# Patient Record
Sex: Female | Born: 1970 | Race: White | Hispanic: No | Marital: Married | Smoking: Never smoker
Health system: Southern US, Community
[De-identification: ages and names within clinical notes are randomized; demographics above are authoritative.]

## PROBLEM LIST (undated history)

## (undated) DIAGNOSIS — Z803 Family history of malignant neoplasm of breast: Secondary | ICD-10-CM

## (undated) DIAGNOSIS — Z1379 Encounter for other screening for genetic and chromosomal anomalies: Principal | ICD-10-CM

## (undated) DIAGNOSIS — Z9189 Other specified personal risk factors, not elsewhere classified: Secondary | ICD-10-CM

## (undated) DIAGNOSIS — E7800 Pure hypercholesterolemia, unspecified: Secondary | ICD-10-CM

## (undated) DIAGNOSIS — F419 Anxiety disorder, unspecified: Secondary | ICD-10-CM

## (undated) DIAGNOSIS — J45909 Unspecified asthma, uncomplicated: Secondary | ICD-10-CM

## (undated) DIAGNOSIS — M719 Bursopathy, unspecified: Secondary | ICD-10-CM

## (undated) DIAGNOSIS — Z91B Personal risk factor of exposure to diethylstilbestrol: Secondary | ICD-10-CM

## (undated) DIAGNOSIS — IMO0002 Reserved for concepts with insufficient information to code with codable children: Secondary | ICD-10-CM

## (undated) DIAGNOSIS — A0472 Enterocolitis due to Clostridium difficile, not specified as recurrent: Secondary | ICD-10-CM

## (undated) HISTORY — DX: Bursopathy, unspecified: M71.9

## (undated) HISTORY — DX: Family history of malignant neoplasm of breast: Z80.3

## (undated) HISTORY — DX: Enterocolitis due to Clostridium difficile, not specified as recurrent: A04.72

## (undated) HISTORY — DX: Encounter for other screening for genetic and chromosomal anomalies: Z13.79

## (undated) HISTORY — DX: Pure hypercholesterolemia, unspecified: E78.00

## (undated) HISTORY — DX: Personal risk factor of exposure to diethylstilbestrol: Z91.B

## (undated) HISTORY — DX: Anxiety disorder, unspecified: F41.9

## (undated) HISTORY — PX: DILATION AND CURETTAGE OF UTERUS: SHX78

## (undated) HISTORY — DX: Reserved for concepts with insufficient information to code with codable children: IMO0002

## (undated) HISTORY — PX: WRIST SURGERY: SHX841

## (undated) HISTORY — DX: Other specified personal risk factors, not elsewhere classified: Z91.89

---

## 1997-11-17 ENCOUNTER — Other Ambulatory Visit: Admission: RE | Admit: 1997-11-17 | Discharge: 1997-11-17 | Payer: Self-pay | Admitting: Gynecology

## 1998-09-16 ENCOUNTER — Inpatient Hospital Stay (HOSPITAL_COMMUNITY): Admission: AD | Admit: 1998-09-16 | Discharge: 1998-09-16 | Payer: Self-pay | Admitting: Gynecology

## 1998-09-17 ENCOUNTER — Inpatient Hospital Stay (HOSPITAL_COMMUNITY): Admission: AD | Admit: 1998-09-17 | Discharge: 1998-09-19 | Payer: Self-pay | Admitting: Gynecology

## 1998-09-20 ENCOUNTER — Encounter (HOSPITAL_COMMUNITY): Admission: RE | Admit: 1998-09-20 | Discharge: 1998-12-19 | Payer: Self-pay | Admitting: Gynecology

## 1998-11-10 ENCOUNTER — Other Ambulatory Visit: Admission: RE | Admit: 1998-11-10 | Discharge: 1998-11-10 | Payer: Self-pay | Admitting: Gynecology

## 2000-03-21 ENCOUNTER — Other Ambulatory Visit: Admission: RE | Admit: 2000-03-21 | Discharge: 2000-03-21 | Payer: Self-pay | Admitting: Gynecology

## 2000-05-30 ENCOUNTER — Other Ambulatory Visit: Admission: RE | Admit: 2000-05-30 | Discharge: 2000-05-30 | Payer: Self-pay | Admitting: Gynecology

## 2000-12-11 ENCOUNTER — Other Ambulatory Visit: Admission: RE | Admit: 2000-12-11 | Discharge: 2000-12-11 | Payer: Self-pay | Admitting: Gynecology

## 2001-07-02 ENCOUNTER — Other Ambulatory Visit: Admission: RE | Admit: 2001-07-02 | Discharge: 2001-07-02 | Payer: Self-pay | Admitting: Gynecology

## 2002-09-29 ENCOUNTER — Encounter: Admission: RE | Admit: 2002-09-29 | Discharge: 2002-09-29 | Payer: Self-pay | Admitting: Gynecology

## 2002-09-29 ENCOUNTER — Encounter: Payer: Self-pay | Admitting: Gynecology

## 2002-10-07 ENCOUNTER — Encounter: Payer: Self-pay | Admitting: Gynecology

## 2002-10-07 ENCOUNTER — Encounter: Admission: RE | Admit: 2002-10-07 | Discharge: 2002-10-07 | Payer: Self-pay | Admitting: Gynecology

## 2002-10-22 ENCOUNTER — Other Ambulatory Visit: Admission: RE | Admit: 2002-10-22 | Discharge: 2002-10-22 | Payer: Self-pay | Admitting: Gynecology

## 2003-02-14 DIAGNOSIS — IMO0002 Reserved for concepts with insufficient information to code with codable children: Secondary | ICD-10-CM

## 2003-02-14 HISTORY — DX: Reserved for concepts with insufficient information to code with codable children: IMO0002

## 2003-10-26 ENCOUNTER — Other Ambulatory Visit: Admission: RE | Admit: 2003-10-26 | Discharge: 2003-10-26 | Payer: Self-pay | Admitting: Gynecology

## 2004-03-04 ENCOUNTER — Encounter: Admission: RE | Admit: 2004-03-04 | Discharge: 2004-03-04 | Payer: Self-pay | Admitting: Gynecology

## 2004-06-06 ENCOUNTER — Other Ambulatory Visit: Admission: RE | Admit: 2004-06-06 | Discharge: 2004-06-06 | Payer: Self-pay | Admitting: Gynecology

## 2004-09-27 ENCOUNTER — Other Ambulatory Visit: Admission: RE | Admit: 2004-09-27 | Discharge: 2004-09-27 | Payer: Self-pay | Admitting: Gynecology

## 2004-10-03 ENCOUNTER — Encounter: Admission: RE | Admit: 2004-10-03 | Discharge: 2004-10-03 | Payer: Self-pay | Admitting: Gynecology

## 2004-11-01 ENCOUNTER — Other Ambulatory Visit: Admission: RE | Admit: 2004-11-01 | Discharge: 2004-11-01 | Payer: Self-pay | Admitting: Gynecology

## 2005-03-09 ENCOUNTER — Encounter: Admission: RE | Admit: 2005-03-09 | Discharge: 2005-03-09 | Payer: Self-pay | Admitting: Gynecology

## 2005-11-23 ENCOUNTER — Other Ambulatory Visit: Admission: RE | Admit: 2005-11-23 | Discharge: 2005-11-23 | Payer: Self-pay | Admitting: Gynecology

## 2006-05-01 ENCOUNTER — Encounter: Admission: RE | Admit: 2006-05-01 | Discharge: 2006-05-01 | Payer: Self-pay | Admitting: Gynecology

## 2007-04-02 ENCOUNTER — Other Ambulatory Visit: Admission: RE | Admit: 2007-04-02 | Discharge: 2007-04-02 | Payer: Self-pay | Admitting: Gynecology

## 2007-06-13 ENCOUNTER — Encounter: Admission: RE | Admit: 2007-06-13 | Discharge: 2007-06-13 | Payer: Self-pay | Admitting: Gynecology

## 2008-06-25 ENCOUNTER — Other Ambulatory Visit: Admission: RE | Admit: 2008-06-25 | Discharge: 2008-06-25 | Payer: Self-pay | Admitting: Gynecology

## 2008-06-25 ENCOUNTER — Encounter: Payer: Self-pay | Admitting: Gynecology

## 2008-06-25 ENCOUNTER — Ambulatory Visit: Payer: Self-pay | Admitting: Gynecology

## 2008-07-17 ENCOUNTER — Encounter: Admission: RE | Admit: 2008-07-17 | Discharge: 2008-07-17 | Payer: Self-pay | Admitting: Gynecology

## 2009-06-28 ENCOUNTER — Ambulatory Visit: Payer: Self-pay | Admitting: Gynecology

## 2009-06-28 ENCOUNTER — Other Ambulatory Visit: Admission: RE | Admit: 2009-06-28 | Discharge: 2009-06-28 | Payer: Self-pay | Admitting: Gynecology

## 2009-06-30 ENCOUNTER — Ambulatory Visit: Payer: Self-pay | Admitting: Gynecology

## 2009-08-27 ENCOUNTER — Encounter: Admission: RE | Admit: 2009-08-27 | Discharge: 2009-08-27 | Payer: Self-pay | Admitting: Gynecology

## 2010-03-06 ENCOUNTER — Encounter: Payer: Self-pay | Admitting: Gynecology

## 2010-03-07 ENCOUNTER — Encounter: Payer: Self-pay | Admitting: Gynecology

## 2010-07-08 ENCOUNTER — Other Ambulatory Visit: Payer: Self-pay | Admitting: Gynecology

## 2010-07-08 ENCOUNTER — Other Ambulatory Visit (HOSPITAL_COMMUNITY)
Admission: RE | Admit: 2010-07-08 | Discharge: 2010-07-08 | Disposition: A | Discharge status: Home / Self Care | Payer: BC Managed Care – PPO | Source: Ambulatory Visit | Attending: Gynecology | Admitting: Gynecology

## 2010-07-08 ENCOUNTER — Encounter (INDEPENDENT_AMBULATORY_CARE_PROVIDER_SITE_OTHER): Payer: BC Managed Care – PPO | Admitting: Gynecology

## 2010-07-08 DIAGNOSIS — E7800 Pure hypercholesterolemia, unspecified: Secondary | ICD-10-CM

## 2010-07-08 DIAGNOSIS — Z124 Encounter for screening for malignant neoplasm of cervix: Secondary | ICD-10-CM | POA: Insufficient documentation

## 2010-07-08 DIAGNOSIS — Z833 Family history of diabetes mellitus: Secondary | ICD-10-CM

## 2010-07-08 DIAGNOSIS — Z01419 Encounter for gynecological examination (general) (routine) without abnormal findings: Secondary | ICD-10-CM

## 2010-07-08 DIAGNOSIS — R823 Hemoglobinuria: Secondary | ICD-10-CM

## 2010-07-18 DIAGNOSIS — Z029 Encounter for administrative examinations, unspecified: Secondary | ICD-10-CM

## 2010-10-05 ENCOUNTER — Other Ambulatory Visit: Payer: Self-pay | Admitting: Gynecology

## 2010-10-05 DIAGNOSIS — Z1231 Encounter for screening mammogram for malignant neoplasm of breast: Secondary | ICD-10-CM

## 2010-10-26 ENCOUNTER — Ambulatory Visit: Payer: BC Managed Care – PPO

## 2011-02-17 ENCOUNTER — Other Ambulatory Visit: Payer: Self-pay | Admitting: Dermatology

## 2011-02-22 ENCOUNTER — Ambulatory Visit
Admission: RE | Admit: 2011-02-22 | Discharge: 2011-02-22 | Disposition: A | Discharge status: Home / Self Care | Payer: BC Managed Care – PPO | Source: Ambulatory Visit | Attending: Gynecology | Admitting: Gynecology

## 2011-02-22 DIAGNOSIS — Z1231 Encounter for screening mammogram for malignant neoplasm of breast: Secondary | ICD-10-CM

## 2011-09-28 ENCOUNTER — Encounter: Payer: Self-pay | Admitting: Gynecology

## 2011-09-28 ENCOUNTER — Ambulatory Visit (INDEPENDENT_AMBULATORY_CARE_PROVIDER_SITE_OTHER): Payer: BC Managed Care – PPO | Admitting: Gynecology

## 2011-09-28 VITALS — BP 124/78 | Ht 67.0 in | Wt 152.0 lb

## 2011-09-28 DIAGNOSIS — Z131 Encounter for screening for diabetes mellitus: Secondary | ICD-10-CM

## 2011-09-28 DIAGNOSIS — N926 Irregular menstruation, unspecified: Secondary | ICD-10-CM

## 2011-09-28 DIAGNOSIS — Z13220 Encounter for screening for lipoid disorders: Secondary | ICD-10-CM

## 2011-09-28 DIAGNOSIS — Z01419 Encounter for gynecological examination (general) (routine) without abnormal findings: Secondary | ICD-10-CM

## 2011-09-28 LAB — CBC WITH DIFFERENTIAL/PLATELET
Basophils Absolute: 0 10*3/uL (ref 0.0–0.1)
Basophils Relative: 1 % (ref 0–1)
Eosinophils Absolute: 0.1 10*3/uL (ref 0.0–0.7)
Eosinophils Relative: 1 % (ref 0–5)
MCH: 29.7 pg (ref 26.0–34.0)
MCHC: 33.7 g/dL (ref 30.0–36.0)
MCV: 88.1 fL (ref 78.0–100.0)
Platelets: 336 10*3/uL (ref 150–400)
RDW: 13 % (ref 11.5–15.5)
WBC: 6.6 10*3/uL (ref 4.0–10.5)

## 2011-09-28 LAB — LIPID PANEL
LDL Cholesterol: 145 mg/dL — ABNORMAL HIGH (ref 0–99)
VLDL: 30 mg/dL (ref 0–40)

## 2011-09-28 LAB — TSH: TSH: 1.789 u[IU]/mL (ref 0.350–4.500)

## 2011-09-28 NOTE — Progress Notes (Signed)
Amanda Haynes [birth year] [id number]        41 y.o.  G5P3 for annual exam.  Several issues noted below.  Past medical history,surgical history, medications, allergies, family history and social history were all reviewed and documented in the EPIC chart. ROS:  Was performed and pertinent positives and negatives are included in the history.  Exam: Amanda Haynes assistant Filed Vitals:   09/28/11 0940  BP: 124/78  Height: 5\' 7"  (1.702 m)  Weight: 152 lb (68.947 kg)   General appearance  Normal Skin grossly normal Head/Neck normal with no cervical or supraclavicular adenopathy thyroid normal Lungs  clear Cardiac RR, without RMG Abdominal  soft, nontender, without masses, organomegaly or hernia Breasts  examined lying and sitting without masses, retractions, discharge or axillary adenopathy. Pelvic  Ext/BUS/vagina  normal   Cervix  normal   Uterus  anteverted, normal size, shape and contour, midline and mobile nontender   Adnexa  Without masses or tenderness    Anus and perineum  normal   Rectovaginal  normal sphincter tone without palpated masses or tenderness.    Assessment/Plan:  41 y.o. G5P3 female for annual exam, vasectomy birth control.   1. Irregular menses. Patient knows of this past year that her periods are monthly but she will spot before and afterwards such that she is bleeding for up to 2 weeks continuously. No hot flushes night sweats other symptoms. We'll check baseline labs to include TSH prolactin FSH and sonohysterogram rule out structural abnormalities. 2. Family history of breast cancer. Mother developed breast cancer at age 59. Also with several maternal great aunts with breast cancer. We have discussed in the past the whole issue of genetic linkage. Her sister did undergo genetic testing which was negative. The issues of aggressive prophylactic management she was seen positive to include prophylactic mastectomies or more aggressive screening with MRI as well as the  prophylactic salpingo-oophorectomy also discussed.  Patient had her mammogram in January. At this point she feels comfortable with continued screening. I did recommend, tomosynthesis with her next mammogram where she does want to pursue genetic screening to let me know. SBE monthly reviewed. 3. Pap smear. Last Pap smear 2012. History of low-grade SIL in 2005 with negative colposcopy and ECC. All subsequent Pap smears have been normal. No Pap smear done today. Discussed current screening guidelines and plan every 3-5 your screening. 4. Health maintenance. Baseline CBC lipid profile glucose urinalysis ordered along with the lab work. Follow up for sonohysterogram otherwise annually.   Dara Lords MD, 10:21 AM 09/28/2011

## 2011-09-28 NOTE — Patient Instructions (Signed)
Follow up for ultrasound as scheduled 

## 2011-09-29 LAB — URINALYSIS W MICROSCOPIC + REFLEX CULTURE
Bacteria, UA: NONE SEEN
Glucose, UA: NEGATIVE mg/dL
Ketones, ur: NEGATIVE mg/dL
Leukocytes, UA: NEGATIVE
Nitrite: NEGATIVE
Protein, ur: NEGATIVE mg/dL

## 2011-09-30 LAB — URINE CULTURE: Colony Count: 75000

## 2011-10-02 ENCOUNTER — Ambulatory Visit (INDEPENDENT_AMBULATORY_CARE_PROVIDER_SITE_OTHER): Payer: BC Managed Care – PPO | Admitting: Gynecology

## 2011-10-02 ENCOUNTER — Encounter: Payer: Self-pay | Admitting: Gynecology

## 2011-10-02 ENCOUNTER — Ambulatory Visit (INDEPENDENT_AMBULATORY_CARE_PROVIDER_SITE_OTHER): Payer: BC Managed Care – PPO

## 2011-10-02 ENCOUNTER — Other Ambulatory Visit: Payer: Self-pay | Admitting: Gynecology

## 2011-10-02 DIAGNOSIS — N840 Polyp of corpus uteri: Secondary | ICD-10-CM

## 2011-10-02 DIAGNOSIS — R1903 Right lower quadrant abdominal swelling, mass and lump: Secondary | ICD-10-CM

## 2011-10-02 DIAGNOSIS — N926 Irregular menstruation, unspecified: Secondary | ICD-10-CM

## 2011-10-02 DIAGNOSIS — N8300 Follicular cyst of ovary, unspecified side: Secondary | ICD-10-CM

## 2011-10-02 DIAGNOSIS — D3910 Neoplasm of uncertain behavior of unspecified ovary: Secondary | ICD-10-CM

## 2011-10-02 DIAGNOSIS — D259 Leiomyoma of uterus, unspecified: Secondary | ICD-10-CM

## 2011-10-02 DIAGNOSIS — R1031 Right lower quadrant pain: Secondary | ICD-10-CM

## 2011-10-02 DIAGNOSIS — D251 Intramural leiomyoma of uterus: Secondary | ICD-10-CM

## 2011-10-02 DIAGNOSIS — N83209 Unspecified ovarian cyst, unspecified side: Secondary | ICD-10-CM

## 2011-10-02 DIAGNOSIS — N920 Excessive and frequent menstruation with regular cycle: Secondary | ICD-10-CM

## 2011-10-02 NOTE — Progress Notes (Signed)
Patient presents for sonohysterogram due to spotting before and after her menses.  Ultrasound shows overall uterine size normal. Several small myomas measuring 13 mm in largest. Endometrial echo 8.7 mm. Right ovary with thin walled cystic mass 38 mm mean diffuse homogeneous low level echoes. Negative color flow. Left ovary normal. Cul-de-sac negative. Sonohysterogram performed, sterile technique, easy catheter introduction, good distention with several small lower uterine segment polyps.  Endometrial sample taken, patient tolerated well.  Assessment and plan: 1. Several small endometrial polyps with prolonged bleeding before and after menses. 2. Low level homogeneous echo cyst 38 mm right ovary, questionable endometrioma versus hemorrhagic cyst. Reviewed both findings with the patient and options. I think from the polyp standpoint hysteroscopy D&C warranted. I reviewed what is involved with the procedure to include the use of the hysteroscope, resectoscope and D&C portion. The risks of infection, prolonged antibiotics, hemorrhage necessitating transfusion and the risks of transfusion, uterine perforation with damage to internal organs including bowel bladder ureters vessels and nerves necessitating major exploratory reparative surgeries and future reparative surgeries including ostomy formation bowel resection bladder repair ureteral damage repair all reviewed with her. The risk of distended media absorption leading to metabolic complications including coma and seizures were reviewed. The need to repeat the ultrasound to follow up on the right ovarian cyst was also discussed and the possibility of laparoscopy if it persists or enlarges. After a lengthy discussion we both agree, assuming the endometrial sample's benign, to wait 2 months and redo the ultrasound. If at that point we feel laparoscopy is warranted them we'll do laparoscopy/hysteroscopy together. If the cyst resolves then we'll proceed with  hysteroscopy alone. Patient agrees with the plan.

## 2011-10-02 NOTE — Patient Instructions (Signed)
Follow up for ultrasound in 2 months 

## 2011-10-10 ENCOUNTER — Other Ambulatory Visit: Payer: Self-pay | Admitting: Gynecology

## 2011-10-10 DIAGNOSIS — E7800 Pure hypercholesterolemia, unspecified: Secondary | ICD-10-CM

## 2011-11-27 ENCOUNTER — Other Ambulatory Visit: Payer: BC Managed Care – PPO

## 2011-11-27 ENCOUNTER — Ambulatory Visit: Payer: BC Managed Care – PPO | Admitting: Gynecology

## 2011-11-29 ENCOUNTER — Other Ambulatory Visit: Payer: BC Managed Care – PPO

## 2011-11-29 ENCOUNTER — Ambulatory Visit: Payer: BC Managed Care – PPO | Admitting: Gynecology

## 2011-12-01 ENCOUNTER — Ambulatory Visit (INDEPENDENT_AMBULATORY_CARE_PROVIDER_SITE_OTHER): Payer: BC Managed Care – PPO | Admitting: Gynecology

## 2011-12-01 ENCOUNTER — Encounter: Payer: Self-pay | Admitting: Gynecology

## 2011-12-01 ENCOUNTER — Ambulatory Visit (INDEPENDENT_AMBULATORY_CARE_PROVIDER_SITE_OTHER): Payer: BC Managed Care – PPO

## 2011-12-01 DIAGNOSIS — R102 Pelvic and perineal pain: Secondary | ICD-10-CM

## 2011-12-01 DIAGNOSIS — N840 Polyp of corpus uteri: Secondary | ICD-10-CM

## 2011-12-01 DIAGNOSIS — N949 Unspecified condition associated with female genital organs and menstrual cycle: Secondary | ICD-10-CM

## 2011-12-01 DIAGNOSIS — N83209 Unspecified ovarian cyst, unspecified side: Secondary | ICD-10-CM

## 2011-12-01 NOTE — Progress Notes (Signed)
Patient presents for follow up ultrasound.  Was evaluated in August for irregular bleeding with sonohysterogram which showed endometrial polyps and a right ovarian ground glass cyst suspicious for endometrioma.  She presents for follow up ultrasound before proceeding with hysteroscopy D&C to see if this cyst needs to be addressed. She does report having pelvic cramping almost daily and discomfort.  Ultrasound shows uterus with 2 small myomas 18 and 10 mm. Endometrial echo 14 mm. Left ovary normal right ovary with persistent avascular groundglass mass 49 x 39 x 42 mm suspicious for endometrioma. No free fluid.  Assessment and plan: History of irregular bleeding with prior sonohysterogram suspicious for endometrial polyps. Persistent right ovarian mass suspicious for endometrioma in patient symptomatic with pelvic discomfort. Recommend proceeding with laparoscopic ovarian cystectomy, hysteroscopy resection of polyp D&C. I reviewed with involved with the procedure to include the expected intraoperative postoperative courses, instrumentation, use of both the laparoscope and hysteroscope, multiple port sites, insufflation, trocar placement, use of electrocautery, sharp blunt dissection, harmonic scalpel, hysteroscopic resectoscope and D&C portion of the procedure. Tentative cystectomy possible salpingo-oophorectomy reviewed. Pathology possibilities to include endometriosis discussed. Patient understands that I may leave active disease or abort the procedure if significant endometriosis with scarring encountered with follow up discussion about more definitive treatment such as hysterectomy. No guarantees for pain relief or bleeding relief reviewed. The risks to include infection, prolonged antibiotics, incisional complications requiring opening and draining of incisions and closure by secondary intention and long-term issues to include cosmetic/keloid and hernia formation reviewed. The risk of hemorrhage necessitating  transfusion and the risks of transfusion discussed to include transfusion reaction, hepatitis, HIV, mad cow disease and other unknown entities. The risk of inadvertent injury to internal organs, either immediately recognized or delay recognized, including bowel, bladder, ureters, vessels and nerves, uterine perforation, cervix and vagina damage necessitating major exploratory reparative surgeries and future reparative surgeries including bowel resection, bladder repair, ureteral damage repair, ostomy formation was all discussed understood and accepted. Issues of distended media absorption, metabolic complications/coma/seizures discussed.  The patient's questions were answered to her satisfaction she is ready to proceed with surgery we will schedule this at her convenience.

## 2011-12-01 NOTE — Patient Instructions (Signed)
Office will contact you to schedule surgery. 

## 2011-12-05 ENCOUNTER — Telehealth: Payer: Self-pay | Admitting: Gynecology

## 2011-12-05 NOTE — Telephone Encounter (Signed)
I called patient and scheduled her D&C, Hysteroscopy and Laparoscopic Ovarian Cystectomy for 01/03/12 at 8:30am at [facility].  Patient's pre-op consult with Dr. Velvet Bathe was scheduled for Friday, Nov 1 at 2:30pm. I discussed patient's financial responsibility to [facility] and will send her a financial letter detailing it.  She will expect a call from [facility] with her hospital instructions.

## 2011-12-15 ENCOUNTER — Ambulatory Visit (INDEPENDENT_AMBULATORY_CARE_PROVIDER_SITE_OTHER): Payer: BC Managed Care – PPO | Admitting: Gynecology

## 2011-12-15 ENCOUNTER — Encounter: Payer: Self-pay | Admitting: Gynecology

## 2011-12-15 DIAGNOSIS — R102 Pelvic and perineal pain: Secondary | ICD-10-CM

## 2011-12-15 DIAGNOSIS — N949 Unspecified condition associated with female genital organs and menstrual cycle: Secondary | ICD-10-CM

## 2011-12-15 DIAGNOSIS — N926 Irregular menstruation, unspecified: Secondary | ICD-10-CM

## 2011-12-15 DIAGNOSIS — N83209 Unspecified ovarian cyst, unspecified side: Secondary | ICD-10-CM

## 2011-12-15 DIAGNOSIS — N840 Polyp of corpus uteri: Secondary | ICD-10-CM

## 2011-12-15 NOTE — H&P (Addendum)
Amanda Haynes 09-15-1970 [id number]   History and Physical   Chief complaint: pelvic cramping, persistent right ovarian cystic mass suspicious for endometrioma, irregular bleeding, endometrial polyp    History of present illness: 41 y.o. G5P3 vasectomy birth control initially evaluated in August with history of prolonged pre-and post menstrual spotting and pelvic cramping. Sonohysterogram showed an endometrial polyp and and right ovarian cyst approximately 40 mm with groundglass appearance suspicious for endometrioma. Endometrial sample was benign. Patient had follow up ultrasound 2 months later with persistence of the cyst, pelvic cramping and pre-and post menstrual spotting. Patient is to be admitted for laparoscopic ovarian cystectomy, hysteroscopy D&C removal of endometrial polyp.    Past medical history,surgical history, medications, allergies, family history and social history were all reviewed and documented in the EPIC chart.  ROS: Was performed and pertinent positives and negatives are included in the history of present illness.    Exam: Kim assistant  General: well developed, well nourished female, no acute distress  HEENT: normal  Lungs: clear to auscultation without wheezing, rales or rhonchi  Cardiac: regular rate without rubs, murmurs or gallops  Abdomen: soft, nontender without masses, guarding, rebound, organomegaly  Pelvic: external bus vagina: normal  Cervix: grossly normal  Uterus: normal size, midline and mobile, nontender  Adnexa: without masses or tenderness    Assessment/Plan: 41 y.o. G5P3 with history of irregular bleeding and ultrasound suspicious for endometrial polyps. Persistent right ovarian mass suspicious for endometrioma in patient symptomatic with pelvic discomfort. For laparoscopic ovarian cystectomy, hysteroscopy resection of polyp D&C. I reviewed with the patient what is involved with the procedure to include the expected intraoperative postoperative  courses, instrumentation, use of both the laparoscope and hysteroscope, multiple port sites, insufflation, trocar placement, use of electrocautery, sharp blunt dissection, harmonic scalpel, hysteroscopic resectoscope and D&C portion of the procedure. Tentative cystectomy possible salpingo-oophorectomy reviewed. Pathology possibilities to include endometriosis discussed. Patient understands that I may leave active disease or abort the procedure if significant endometriosis with scarring encountered with follow up discussion about more definitive treatment such as hysterectomy. No guarantees for pain relief or bleeding relief reviewed. The risks to include infection, prolonged antibiotics, incisional complications requiring opening and draining of incisions and closure by secondary intention and long-term issues to include cosmetic/keloid and hernia formation reviewed. The risk of hemorrhage necessitating transfusion and the risks of transfusion discussed to include transfusion reaction, hepatitis, HIV, mad cow disease and other unknown entities. The risk of inadvertent injury to internal organs, either immediately recognized or delay recognized, including bowel, bladder, ureters, vessels and nerves, uterine perforation, cervix and vagina damage necessitating major exploratory reparative surgeries and future reparative surgeries including bowel resection, bladder repair, ureteral damage repair, ostomy formation was all discussed understood and accepted. Issues of distended media absorption, metabolic complications/coma/seizures discussed. The patient's questions were answered to her satisfaction she is ready to proceed with surgery we will schedule this at her convenience.      Dara Lords MD, 3:12 PM 12/15/2011

## 2011-12-15 NOTE — Patient Instructions (Signed)
Followup for surgery as scheduled. 

## 2011-12-15 NOTE — Progress Notes (Signed)
Amanda Haynes 1970-04-23 [id number]   Preoperative consult  Chief complaint: pelvic cramping, persistent left ovarian cystic mass suspicious for endometrioma, irregular bleeding, endometrial polyp  History of present illness: 41 y.o. G5P3 vasectomy birth control initially evaluated in August with history of prolonged pre-and post menstrual spotting and pelvic cramping. Sonohysterogram showed an endometrial polyp and and right ovarian cyst approximately 40 mm with groundglass appearance suspicious for endometrioma. Endometrial sample was benign.  Patient had follow up ultrasound 2 months later with persistence of the cyst, pelvic cramping and pre-and post menstrual spotting.  Patient is to be admitted for laparoscopic ovarian cystectomy, hysteroscopy D&C removal of endometrial polyp.  Past medical history,surgical history, medications, allergies, family history and social history were all reviewed and documented in the EPIC chart. ROS:  Was performed and pertinent positives and negatives are included in the history of present illness.  Exam:  Kim assistant General: well developed, well nourished female, no acute distress HEENT: normal  Lungs: clear to auscultation without wheezing, rales or rhonchi  Cardiac: regular rate without rubs, murmurs or gallops  Abdomen: soft, nontender without masses, guarding, rebound, organomegaly  Pelvic: external bus vagina: normal   Cervix: grossly normal  Uterus: normal size, midline and mobile, nontender  Adnexa: without masses or tenderness      Assessment/Plan:  41 y.o. G5P3 with history of irregular bleeding and ultrasound suspicious for endometrial polyps. Persistent right ovarian mass suspicious for endometrioma in patient symptomatic with pelvic discomfort. For laparoscopic ovarian cystectomy, hysteroscopy resection of polyp D&C. I reviewed with the patient what is involved with the procedure to include the expected intraoperative postoperative  courses, instrumentation, use of both the laparoscope and hysteroscope, multiple port sites, insufflation, trocar placement, use of electrocautery, sharp blunt dissection, harmonic scalpel, hysteroscopic resectoscope and D&C portion of the procedure. Tentative cystectomy possible salpingo-oophorectomy reviewed. Pathology possibilities to include endometriosis discussed. Patient understands that I may leave active disease or abort the procedure if significant endometriosis with scarring encountered with follow up discussion about more definitive treatment such as hysterectomy. No guarantees for pain relief or bleeding relief reviewed. The risks to include infection, prolonged antibiotics, incisional complications requiring opening and draining of incisions and closure by secondary intention and long-term issues to include cosmetic/keloid and hernia formation reviewed. The risk of hemorrhage necessitating transfusion and the risks of transfusion discussed to include transfusion reaction, hepatitis, HIV, mad cow disease and other unknown entities. The risk of inadvertent injury to internal organs, either immediately recognized or delay recognized, including bowel, bladder, ureters, vessels and nerves, uterine perforation, cervix and vagina damage necessitating major exploratory reparative surgeries and future reparative surgeries including bowel resection, bladder repair, ureteral damage repair, ostomy formation was all discussed understood and accepted. Issues of distended media absorption, metabolic complications/coma/seizures discussed. The patient's questions were answered to her satisfaction she is ready to proceed with surgery we will schedule this at her convenience.      Dara Lords MD, 3:00 PM 12/15/2011

## 2011-12-20 ENCOUNTER — Encounter (HOSPITAL_COMMUNITY): Payer: Self-pay | Admitting: Pharmacist

## 2011-12-29 ENCOUNTER — Encounter (HOSPITAL_COMMUNITY)
Admission: RE | Admit: 2011-12-29 | Discharge: 2011-12-29 | Disposition: A | Discharge status: Home / Self Care | Payer: BC Managed Care – PPO | Source: Ambulatory Visit | Attending: Gynecology | Admitting: Gynecology

## 2011-12-29 ENCOUNTER — Encounter (HOSPITAL_COMMUNITY): Payer: Self-pay

## 2011-12-29 HISTORY — DX: Unspecified asthma, uncomplicated: J45.909

## 2011-12-29 LAB — CBC
Hemoglobin: 13.6 g/dL (ref 12.0–15.0)
MCHC: 33.6 g/dL (ref 30.0–36.0)
RDW: 12.4 % (ref 11.5–15.5)
WBC: 10.2 10*3/uL (ref 4.0–10.5)

## 2011-12-29 LAB — SURGICAL PCR SCREEN
MRSA, PCR: NEGATIVE
Staphylococcus aureus: NEGATIVE

## 2011-12-29 NOTE — Patient Instructions (Addendum)
Your procedure is scheduled on:01/03/12  Enter through the Main Entrance at :0700 Pick up desk phone and dial [phone] and inform us of your arrival.  Please call [phone] if you have any problems the morning of surgery.  Remember: Do not eat or drink after midnight:Tuesday   Take these meds the morning of surgery with a sip of water:none  DO NOT wear jewelry, eye make-up, lipstick,body lotion, or dark fingernail polish. Do not shave for 48 hours prior to surgery.   Patients discharged on the day of surgery will not be allowed to drive home.

## 2012-01-02 MED ORDER — DEXTROSE 5 % IV SOLN
2.0000 g | INTRAVENOUS | Status: AC
  Administered 2012-01-03: 2 g via INTRAVENOUS
  Filled 2012-01-02: qty 2

## 2012-01-03 ENCOUNTER — Ambulatory Visit (HOSPITAL_COMMUNITY): Payer: BC Managed Care – PPO | Admitting: Anesthesiology

## 2012-01-03 ENCOUNTER — Ambulatory Visit (HOSPITAL_COMMUNITY)
Admission: RE | Admit: 2012-01-03 | Discharge: 2012-01-03 | Disposition: A | Discharge status: Home / Self Care | Payer: BC Managed Care – PPO | Source: Ambulatory Visit | Attending: Gynecology | Admitting: Gynecology

## 2012-01-03 ENCOUNTER — Encounter (HOSPITAL_COMMUNITY): Payer: Self-pay | Admitting: Anesthesiology

## 2012-01-03 ENCOUNTER — Encounter (HOSPITAL_COMMUNITY): Payer: Self-pay | Admitting: General Practice

## 2012-01-03 ENCOUNTER — Encounter (HOSPITAL_COMMUNITY)
Admission: RE | Disposition: A | Discharge status: Home / Self Care | Payer: Self-pay | Source: Ambulatory Visit | Attending: Gynecology

## 2012-01-03 DIAGNOSIS — Z01812 Encounter for preprocedural laboratory examination: Secondary | ICD-10-CM | POA: Insufficient documentation

## 2012-01-03 DIAGNOSIS — N83209 Unspecified ovarian cyst, unspecified side: Secondary | ICD-10-CM | POA: Insufficient documentation

## 2012-01-03 DIAGNOSIS — N840 Polyp of corpus uteri: Secondary | ICD-10-CM | POA: Insufficient documentation

## 2012-01-03 DIAGNOSIS — N938 Other specified abnormal uterine and vaginal bleeding: Secondary | ICD-10-CM | POA: Insufficient documentation

## 2012-01-03 DIAGNOSIS — N801 Endometriosis of ovary: Secondary | ICD-10-CM | POA: Insufficient documentation

## 2012-01-03 DIAGNOSIS — N80109 Endometriosis of ovary, unspecified side, unspecified depth: Secondary | ICD-10-CM | POA: Insufficient documentation

## 2012-01-03 DIAGNOSIS — N800 Endometriosis of uterus: Secondary | ICD-10-CM

## 2012-01-03 DIAGNOSIS — N803 Endometriosis of pelvic peritoneum, unspecified: Secondary | ICD-10-CM | POA: Insufficient documentation

## 2012-01-03 DIAGNOSIS — Z01818 Encounter for other preprocedural examination: Secondary | ICD-10-CM | POA: Insufficient documentation

## 2012-01-03 DIAGNOSIS — N912 Amenorrhea, unspecified: Secondary | ICD-10-CM

## 2012-01-03 DIAGNOSIS — N949 Unspecified condition associated with female genital organs and menstrual cycle: Secondary | ICD-10-CM | POA: Insufficient documentation

## 2012-01-03 DIAGNOSIS — R109 Unspecified abdominal pain: Secondary | ICD-10-CM | POA: Insufficient documentation

## 2012-01-03 HISTORY — PX: LAPAROSCOPY: SHX197

## 2012-01-03 HISTORY — PX: DILATATION & CURRETTAGE/HYSTEROSCOPY WITH RESECTOCOPE: SHX5572

## 2012-01-03 SURGERY — DILATATION & CURETTAGE/HYSTEROSCOPY WITH RESECTOCOPE
Anesthesia: General | Site: Uterus | Laterality: Right | Wound class: Clean Contaminated

## 2012-01-03 MED ORDER — FENTANYL CITRATE 0.05 MG/ML IJ SOLN
INTRAMUSCULAR | Status: AC
  Filled 2012-01-03: qty 5

## 2012-01-03 MED ORDER — BUPIVACAINE HCL (PF) 0.25 % IJ SOLN
INTRAMUSCULAR | Status: DC | PRN
  Administered 2012-01-03: 30 mL

## 2012-01-03 MED ORDER — NEOSTIGMINE METHYLSULFATE 1 MG/ML IJ SOLN
INTRAMUSCULAR | Status: AC
  Filled 2012-01-03: qty 10

## 2012-01-03 MED ORDER — HYDROMORPHONE HCL PF 1 MG/ML IJ SOLN
INTRAMUSCULAR | Status: AC
  Filled 2012-01-03: qty 1

## 2012-01-03 MED ORDER — GLYCOPYRROLATE 0.2 MG/ML IJ SOLN
INTRAMUSCULAR | Status: AC
  Filled 2012-01-03: qty 2

## 2012-01-03 MED ORDER — HEPARIN SODIUM (PORCINE) 5000 UNIT/ML IJ SOLN
INTRAMUSCULAR | Status: DC | PRN
  Administered 2012-01-03: 5000 [IU] via SUBCUTANEOUS

## 2012-01-03 MED ORDER — OXYCODONE-ACETAMINOPHEN 5-325 MG PO TABS: 1.0000 | ORAL_TABLET | ORAL | Status: DC | PRN

## 2012-01-03 MED ORDER — DEXTROSE-NACL 5-0.9 % IV SOLN: INTRAVENOUS | Status: DC

## 2012-01-03 MED ORDER — MEPERIDINE HCL 25 MG/ML IJ SOLN: 6.2500 mg | INTRAMUSCULAR | Status: DC | PRN

## 2012-01-03 MED ORDER — DEXAMETHASONE SODIUM PHOSPHATE 10 MG/ML IJ SOLN
INTRAMUSCULAR | Status: AC
  Filled 2012-01-03: qty 1

## 2012-01-03 MED ORDER — ONDANSETRON HCL 4 MG/2ML IJ SOLN
INTRAMUSCULAR | Status: AC
  Filled 2012-01-03: qty 2

## 2012-01-03 MED ORDER — LIDOCAINE HCL (CARDIAC) 20 MG/ML IV SOLN
INTRAVENOUS | Status: AC
  Filled 2012-01-03: qty 5

## 2012-01-03 MED ORDER — LACTATED RINGERS IV SOLN
INTRAVENOUS | Status: DC
  Administered 2012-01-03 (×3): via INTRAVENOUS

## 2012-01-03 MED ORDER — LIDOCAINE HCL (CARDIAC) 20 MG/ML IV SOLN
INTRAVENOUS | Status: DC | PRN
  Administered 2012-01-03: 60 mg via INTRAVENOUS

## 2012-01-03 MED ORDER — MIDAZOLAM HCL 2 MG/2ML IJ SOLN
INTRAMUSCULAR | Status: AC
  Filled 2012-01-03: qty 2

## 2012-01-03 MED ORDER — LIDOCAINE HCL 1 % IJ SOLN
INTRAMUSCULAR | Status: DC | PRN
  Administered 2012-01-03: 10 mL

## 2012-01-03 MED ORDER — ROCURONIUM BROMIDE 100 MG/10ML IV SOLN
INTRAVENOUS | Status: DC | PRN
  Administered 2012-01-03: 35 mg via INTRAVENOUS
  Administered 2012-01-03 (×2): 5 mg via INTRAVENOUS
  Administered 2012-01-03: 10 mg via INTRAVENOUS

## 2012-01-03 MED ORDER — PROPOFOL 10 MG/ML IV EMUL
INTRAVENOUS | Status: AC
  Filled 2012-01-03: qty 20

## 2012-01-03 MED ORDER — FENTANYL CITRATE 0.05 MG/ML IJ SOLN: 25.0000 ug | INTRAMUSCULAR | Status: DC | PRN

## 2012-01-03 MED ORDER — ONDANSETRON HCL 4 MG/2ML IJ SOLN: 4.0000 mg | Freq: Once | INTRAMUSCULAR | Status: DC | PRN

## 2012-01-03 MED ORDER — MIDAZOLAM HCL 5 MG/5ML IJ SOLN
INTRAMUSCULAR | Status: DC | PRN
  Administered 2012-01-03: 2 mg via INTRAVENOUS

## 2012-01-03 MED ORDER — HYDROMORPHONE HCL PF 1 MG/ML IJ SOLN
INTRAMUSCULAR | Status: DC | PRN
  Administered 2012-01-03 (×2): 1 mg via INTRAVENOUS

## 2012-01-03 MED ORDER — FENTANYL CITRATE 0.05 MG/ML IJ SOLN
INTRAMUSCULAR | Status: DC | PRN
  Administered 2012-01-03 (×5): 50 ug via INTRAVENOUS

## 2012-01-03 MED ORDER — ROCURONIUM BROMIDE 50 MG/5ML IV SOLN
INTRAVENOUS | Status: AC
  Filled 2012-01-03: qty 1

## 2012-01-03 MED ORDER — KETOROLAC TROMETHAMINE 30 MG/ML IJ SOLN: 15.0000 mg | Freq: Once | INTRAMUSCULAR | Status: DC | PRN

## 2012-01-03 MED ORDER — PROPOFOL 10 MG/ML IV EMUL
INTRAVENOUS | Status: DC | PRN
  Administered 2012-01-03: 150 mg via INTRAVENOUS

## 2012-01-03 MED ORDER — DEXAMETHASONE SODIUM PHOSPHATE 4 MG/ML IJ SOLN
INTRAMUSCULAR | Status: DC | PRN
  Administered 2012-01-03: 10 mg via INTRAVENOUS

## 2012-01-03 MED ORDER — NEOSTIGMINE METHYLSULFATE 1 MG/ML IJ SOLN
INTRAMUSCULAR | Status: DC | PRN
  Administered 2012-01-03: 2 mg via INTRAVENOUS

## 2012-01-03 MED ORDER — ONDANSETRON HCL 4 MG/2ML IJ SOLN
INTRAMUSCULAR | Status: DC | PRN
  Administered 2012-01-03: 4 mg via INTRAVENOUS

## 2012-01-03 MED ORDER — GLYCOPYRROLATE 0.2 MG/ML IJ SOLN
INTRAMUSCULAR | Status: DC | PRN
  Administered 2012-01-03: 0.4 mg via INTRAVENOUS

## 2012-01-03 MED ORDER — LACTATED RINGERS IR SOLN
Status: DC | PRN
  Administered 2012-01-03: 3000 mL

## 2012-01-03 SURGICAL SUPPLY — 38 items
BAG SPEC RTRVL LRG 6X4 10 (ENDOMECHANICALS)
BARRIER ADHS 3X4 INTERCEED (GAUZE/BANDAGES/DRESSINGS) IMPLANT
BLADE SURG 15 STRL LF C SS BP (BLADE) ×2 IMPLANT
BLADE SURG 15 STRL SS (BLADE) ×3
BRR ADH 4X3 ABS CNTRL BYND (GAUZE/BANDAGES/DRESSINGS)
CABLE HIGH FREQUENCY MONO STRZ (ELECTRODE) IMPLANT
CANISTER SUCTION 2500CC (MISCELLANEOUS) ×3 IMPLANT
CATH ROBINSON RED A/P 16FR (CATHETERS) ×3 IMPLANT
CLOTH BEACON ORANGE TIMEOUT ST (SAFETY) ×3 IMPLANT
CONTAINER PREFILL 10% NBF 60ML (FORM) ×6 IMPLANT
DRESSING TELFA 8X3 (GAUZE/BANDAGES/DRESSINGS) ×3 IMPLANT
ELECT LOOP GYNE PRO 24FR (CUTTING LOOP) ×3
ELECT REM PT RETURN 9FT ADLT (ELECTROSURGICAL)
ELECTRODE LOOP GYNE PRO 24FR (CUTTING LOOP) IMPLANT
ELECTRODE REM PT RTRN 9FT ADLT (ELECTROSURGICAL) IMPLANT
GLOVE BIO SURGEON STRL SZ7.5 (GLOVE) ×6 IMPLANT
GOWN PREVENTION PLUS LG XLONG (DISPOSABLE) ×6 IMPLANT
GOWN STRL REIN XL XLG (GOWN DISPOSABLE) ×9 IMPLANT
LOOP ANGLED CUTTING 22FR (CUTTING LOOP) ×4 IMPLANT
NDL SPNL 22GX3.5 QUINCKE BK (NEEDLE) IMPLANT
NEEDLE SPNL 22GX3.5 QUINCKE BK (NEEDLE) IMPLANT
NS IRRIG 1000ML POUR BTL (IV SOLUTION) ×3 IMPLANT
PACK HYSTEROSCOPY LF (CUSTOM PROCEDURE TRAY) ×3 IMPLANT
PACK LAPAROSCOPY BASIN (CUSTOM PROCEDURE TRAY) ×3 IMPLANT
PAD OB MATERNITY 4.3X12.25 (PERSONAL CARE ITEMS) ×3 IMPLANT
POUCH SPECIMEN RETRIEVAL 10MM (ENDOMECHANICALS) IMPLANT
PROTECTOR NERVE ULNAR (MISCELLANEOUS) ×3 IMPLANT
SCALPEL HARMONIC ACE (MISCELLANEOUS) ×1 IMPLANT
SET IRRIG TUBING LAPAROSCOPIC (IRRIGATION / IRRIGATOR) ×1 IMPLANT
SLEEVE Z-THREAD 5X100MM (TROCAR) IMPLANT
SOLUTION ELECTROLUBE (MISCELLANEOUS) IMPLANT
SUT PLAIN 4 0 FS 2 27 (SUTURE) ×3 IMPLANT
SUT VICRYL 0 ENDOLOOP (SUTURE) IMPLANT
SUT VICRYL 0 UR6 27IN ABS (SUTURE) ×3 IMPLANT
TOWEL OR 17X24 6PK STRL BLUE (TOWEL DISPOSABLE) ×6 IMPLANT
TROCAR XCEL NON-BLD 11X100MML (ENDOMECHANICALS) ×3 IMPLANT
TROCAR Z-THREAD FIOS 5X100MM (TROCAR) ×4 IMPLANT
WATER STERILE IRR 1000ML POUR (IV SOLUTION) ×3 IMPLANT

## 2012-01-03 NOTE — Op Note (Signed)
Amanda Haynes 09/27/1970 [id number]   Post Operative Note   Date of surgery:  01/03/2012  Pre Op Dx:  Pelvic cramping, persistent right ovarian cyst suspicious for endometrioma, irregular bleeding, endometrial polyp  Post Op Dx:  Endometrioma right ovary, peritoneal endometriosis, endometrial polyp  Procedure:  Laparoscopic right ovarian cystectomy, left ovarian cyst drainage, fulguration ovarian/peritoneal endometriosis, hysteroscopy D&C.  Surgeon:  Dara Lords  Anesthesia:  General  EBL:  minimal  Distended media discrepancy:  Estimated 100 cc noting gross amount of spillage on the floor  Complications:  None  Specimen:  #1 opening cell washings #2 right ovarian endometrioma #3 endometrial curettings with polyps to pathology  Findings: EUA:  External BUS vagina normal, cervix normal, uterus normal size midline mobile, adnexa without masses   Operative:  Laparoscopy:  Anterior cul-de-sac normal. Posterior cul-de-sac with small fibrotic endometriotic implant. Uterus normal size shape and contour. Right and left fallopian tubes normal length caliber fimbriated ends. Left ovary normal size with corpus luteal cyst. Several classic endometriotic implants on the capsule. Right ovary enlarged with 4 cm endometrioma excised completely. Left pelvic sidewall with classic endometriotic implants superior to the course of the ureter away from the vessels. A predominantly exam shows liver smooth no abnormalities. Gallbladder not visualized. Appendix grossly normal free and mobile.  Hysteroscopy: Fundus, anterior/posterior uterine surfaces, lower uterine segment, endocervical canal, right and left tubal ostia all visualized.  3 small classic polyps lower posterior uterine surface. Undulating endometrial pattern.   Procedure:  The patient was taken to the operating room, underwent general anesthesia, placed in the low dorsal lithotomy position, received abdominal perineal and vaginal  preparation with Betadine solution, EUA performed and a Foley catheter was placed. A timeout was performed by the surgical team. The patient was draped in the usual fashion and a vertical infraumbilical incision was made and using the 10 mm Optiview trocar the abdomen was record under direct visualization without difficulty. The abdomen was then insufflated and right and left 5 mm suprapubic ports were then placed under direct visualization after transillumination for the vessels without difficulty. Examination of the pelvic organs and upper abdominal exam was carried out with findings noted above. An opening cell washings was taken and sent to pathology. The right ovary was elevated, stabilized and the cystic mass was incised with return of classic endometriotic fluid.  The ovarian cyst was irrigated and subsequently the pelvis was aspirated after abundant irrigation.  The ovarian capsule overlying the prominent portion of the cyst was excised and subsequently the remaining cyst wall was stripped from the underlying ovarian stroma and this was all sent to pathology. Appears that all remaining systems wall was removed and several bleeding sites were bipolar cauterized to achieve ovarian hemostasis. A smaller cyst on the left ovary was identified and incised to rule out endometrioma and was found to be a classic corpus luteum and this was left undisturbed. Several areas of left ovarian capsular endometriosis was bipolar cauterized and the posterior cul-de-sac and left pelvic sidewall endometriotic implants were superficially bipolar cauterized after identification of the ureteral course in large vessels. The pelvis was copiously irrigated noting adequate hemostasis. The gas slowly allowed to escape and all incision sites reinspected under low-pressure situation showing adequate hemostasis. The right and left 5 mm ports were removed showing adequate hemostasis in the inferoapical port was backed out under  visualization showing adequate hemostasis and no evidence of hernia formation. All skin incisions were injected using 0.25% Marcaine and a 0 Vicryl interrupted  subcutaneous/fascial stitch was placed infraumbilically.  The infraumbilical skin was reapproximated with 40 plain suture in a running subcuticular stitch and all skin incisions were then closed using Dermabond skin adhesive.  Attention was then turned to the hysteroscopic portion of the case and the cervix was visualized with a speculum grasped with a single-tooth tenaculum and a paracervical block using 1% lidocaine was placed a total of 10 cc. The cervix was gently dilated to admit the operative 5 mm hysteroscope a hysteroscopy was performed with findings noted above. A sharp curettage was performed to empty the uterine cavity and this was sent to pathology. Rehysteroscopy showed an empty cavity good distention with no evidence of perforation. The Foley catheter was then removed noting clear yellow urine in the bag, the patient placed in the supine position, awakened without difficulty and taken to the recovery room in good condition having tolerated the procedure well.    Dara Lords MD, 10:44 AM 01/03/2012

## 2012-01-03 NOTE — Anesthesia Preprocedure Evaluation (Addendum)
Anesthesia Evaluation  Patient identified by MRN, date of birth, ID band Patient awake    Reviewed: Allergy & Precautions, H&P , NPO status , Patient's Chart, lab work & pertinent test results  Airway Mallampati: I TM Distance: >3 FB Neck ROM: full    Dental No notable dental hx. (+) Teeth Intact   Pulmonary  - rhonchi  Pulmonary exam normal       Cardiovascular negative cardio ROS      Neuro/Psych negative neurological ROS  negative psych ROS   GI/Hepatic negative GI ROS, Neg liver ROS,   Endo/Other  negative endocrine ROS  Renal/GU negative Renal ROS  negative genitourinary   Musculoskeletal   Abdominal Normal abdominal exam  (+)   Peds  Hematology negative hematology ROS (+)   Anesthesia Other Findings   Reproductive/Obstetrics negative OB ROS                           Anesthesia Physical Anesthesia Plan  ASA: II  Anesthesia Plan: General   Post-op Pain Management:    Induction: Intravenous  Airway Management Planned: Oral ETT  Additional Equipment:   Intra-op Plan:   Post-operative Plan: Extubation in OR  Informed Consent: I have reviewed the patients History and Physical, chart, labs and discussed the procedure including the risks, benefits and alternatives for the proposed anesthesia with the patient or authorized representative who has indicated his/her understanding and acceptance.   Dental Advisory Given  Plan Discussed with: CRNA and Surgeon  Anesthesia Plan Comments:         Anesthesia Quick Evaluation

## 2012-01-03 NOTE — Transfer of Care (Signed)
Immediate Anesthesia Transfer of Care Note  Patient: Amanda Haynes  Procedure(s) Performed: Procedure(s) (LRB) with comments: DILATATION & CURETTAGE/HYSTEROSCOPY WITH RESECTOCOPE (N/A) LAPAROSCOPY OPERATIVE (Right) -  Ovarian Cystectomy    Patient Location: PACU  Anesthesia Type:General  Level of Consciousness: awake, alert , oriented and patient cooperative  Airway & Oxygen Therapy: Patient Spontanous Breathing and Patient connected to nasal cannula oxygen  Post-op Assessment: Report given to PACU RN and Post -op Vital signs reviewed and stable  Post vital signs: Reviewed and stable  Complications: No apparent anesthesia complications

## 2012-01-03 NOTE — Anesthesia Postprocedure Evaluation (Signed)
  Anesthesia Post-op Note  Patient: Amanda Haynes  Procedure(s) Performed: Procedure(s) (LRB) with comments: DILATATION & CURETTAGE/HYSTEROSCOPY WITH RESECTOCOPE (N/A) - start sec [phone] LAPAROSCOPY OPERATIVE (Right) - right  Ovarian Cystectomy fulguration on endometriosis right ovariann  cyst drainage    Patient Location: PACU  Anesthesia Type:General  Level of Consciousness: awake, alert  and oriented  Airway and Oxygen Therapy: Patient Spontanous Breathing  Post-op Pain: none  Post-op Assessment: Post-op Vital signs reviewed, Patient's Cardiovascular Status Stable, Respiratory Function Stable, Patent Airway, No signs of Nausea or vomiting and Pain level controlled  Post-op Vital Signs: Reviewed and stable  Complications: No apparent anesthesia complications

## 2012-01-03 NOTE — Preoperative (Signed)
Beta Blockers   Reason not to administer Beta Blockers:Not Applicable 

## 2012-01-03 NOTE — H&P (Addendum)
  The patient was examined.  I reviewed the proposed surgery and consent form with the patient.  The dictated history and physical is current and accurate and all questions were answered. The patient is ready to proceed with surgery and has a realistic understanding and expectation for the outcome.  Of note H&P states left cyst and ultrasound, physician and patient agree it is the right cyst.   Dara Lords MD, 8:21 AM 01/03/2012

## 2012-01-04 ENCOUNTER — Telehealth: Payer: Self-pay | Admitting: Gynecology

## 2012-01-04 ENCOUNTER — Encounter (HOSPITAL_COMMUNITY): Payer: Self-pay | Admitting: Gynecology

## 2012-01-04 MED FILL — Heparin Sodium (Porcine) Inj 5000 Unit/ML: INTRAMUSCULAR | Qty: 1 | Status: AC

## 2012-01-04 NOTE — Telephone Encounter (Signed)
Called patient in follow up of surgery. She's doing well with minimal pain. I reviewed pathology findings which was a benign endometriotic cyst. She'll make an appointment to see me in 2 weeks for postoperative checkup, sooner if any issues.

## 2012-01-19 ENCOUNTER — Ambulatory Visit (INDEPENDENT_AMBULATORY_CARE_PROVIDER_SITE_OTHER): Payer: BC Managed Care – PPO | Admitting: Gynecology

## 2012-01-19 ENCOUNTER — Encounter: Payer: Self-pay | Admitting: Gynecology

## 2012-01-19 DIAGNOSIS — N809 Endometriosis, unspecified: Secondary | ICD-10-CM

## 2012-01-19 NOTE — Patient Instructions (Signed)
Assuming he continues to do well, follow up in August 2014 you for your annual exam.

## 2012-01-19 NOTE — Progress Notes (Signed)
Postop visit status post laparoscopic right ovarian cystectomy of endometrioma, fulguration of endometriosis, hysteroscopy D&C for endometrial polyp.  Pathology was benign confirming endometrioma right ovary and benign polypoid secratory endometrium.  Patient reports doing well with no complaints.   Exam was Amanda Haynes assistant Abdomen soft nontender without masses guarding rebound organomegaly. Incision sites healed nicely.  Pelvic external BUS vagina normal bimanual uterus normal size midline mobile nontender. Adnexa without masses or tenderness.  Assessment and plan: Postop laparoscopic right ovarian endometrioma excision hysteroscopy D&C doing well. Patient will keep pain/menstrual calendar. She wished as well she'll follow up in August at her annual, sooner as needed. Patient does note that she already feels better from a pain standpoint then preoperatively.

## 2012-02-14 DIAGNOSIS — A0472 Enterocolitis due to Clostridium difficile, not specified as recurrent: Secondary | ICD-10-CM

## 2012-02-14 HISTORY — DX: Enterocolitis due to Clostridium difficile, not specified as recurrent: A04.72

## 2012-05-06 ENCOUNTER — Other Ambulatory Visit: Payer: Self-pay

## 2012-05-06 DIAGNOSIS — Z1231 Encounter for screening mammogram for malignant neoplasm of breast: Secondary | ICD-10-CM

## 2012-05-27 ENCOUNTER — Ambulatory Visit
Admission: RE | Admit: 2012-05-27 | Discharge: 2012-05-27 | Disposition: A | Discharge status: Home / Self Care | Payer: BC Managed Care – PPO | Source: Ambulatory Visit

## 2012-05-27 DIAGNOSIS — Z1231 Encounter for screening mammogram for malignant neoplasm of breast: Secondary | ICD-10-CM

## 2012-11-18 ENCOUNTER — Ambulatory Visit (INDEPENDENT_AMBULATORY_CARE_PROVIDER_SITE_OTHER): Payer: BC Managed Care – PPO | Admitting: Gynecology

## 2012-11-18 ENCOUNTER — Encounter: Payer: Self-pay | Admitting: Gynecology

## 2012-11-18 VITALS — BP 124/78 | Ht 66.5 in | Wt 159.0 lb

## 2012-11-18 DIAGNOSIS — Z01419 Encounter for gynecological examination (general) (routine) without abnormal findings: Secondary | ICD-10-CM

## 2012-11-18 DIAGNOSIS — N809 Endometriosis, unspecified: Secondary | ICD-10-CM

## 2012-11-18 DIAGNOSIS — Z13220 Encounter for screening for lipoid disorders: Secondary | ICD-10-CM

## 2012-11-18 LAB — CBC WITH DIFFERENTIAL/PLATELET
Basophils Absolute: 0 10*3/uL (ref 0.0–0.1)
Eosinophils Relative: 1 % (ref 0–5)
HCT: 39.5 % (ref 36.0–46.0)
Hemoglobin: 13.6 g/dL (ref 12.0–15.0)
Lymphocytes Relative: 28 % (ref 12–46)
Lymphs Abs: 2.5 10*3/uL (ref 0.7–4.0)
MCV: 88 fL (ref 78.0–100.0)
Monocytes Absolute: 0.8 10*3/uL (ref 0.1–1.0)
Neutro Abs: 5.5 10*3/uL (ref 1.7–7.7)
RBC: 4.49 MIL/uL (ref 3.87–5.11)
RDW: 12.9 % (ref 11.5–15.5)
WBC: 8.9 10*3/uL (ref 4.0–10.5)

## 2012-11-18 NOTE — Patient Instructions (Signed)
Call if you want a referral to a genetic counselor to discuss breast cancer gene testing. Followup at any time if you want to pursue testing and we can draw this at our lab. Otherwise followup in one year for annual exam.

## 2012-11-18 NOTE — Progress Notes (Signed)
Amanda Haynes 1970/09/21 [id number]       42 y.o.  [id number] for annual exam.  Doing well without complaints.  Past medical history,surgical history, medications, allergies, family history and social history were all reviewed and documented in the EPIC chart.  ROS:  Performed and pertinent positives and negatives are included in the history, assessment and plan .  Exam: Kim assistant Filed Vitals:   11/18/12 1529  BP: 124/78  Height: 5' 6.5" (1.689 m)  Weight: 159 lb (72.122 kg)   General appearance  Normal Skin grossly normal Head/Neck normal with no cervical or supraclavicular adenopathy thyroid normal Lungs  clear Cardiac RR, without RMG Abdominal  soft, nontender, without masses, organomegaly or hernia Breasts  examined lying and sitting without masses, retractions, discharge or axillary adenopathy. Pelvic  Ext/BUS/vagina  normal  Cervix  normal  Uterus  anteverted, normal size, shape and contour, midline and mobile nontender   Adnexa  Without masses or tenderness    Anus and perineum  normal   Rectovaginal  normal sphincter tone without palpated masses or tenderness.    Assessment/Plan:  42 y.o. [id number] female for annual exam, regular menses, vasectomy birth control.   1. Mammography 05/2012. Mother with breast cancer age 34 subsequently committed suicide. Patient's sister was genetically tested and negative. I again reviewed with her the issues of genetic testing and what she would do with the results. I also reviewed the implications for her children. High lifetime risk of breast cancer and ovarian cancer if positive. Availability of increased surveillance like annual MRIs and prophylactic mastectomies as well as prophylactic salpingo-oophorectomies. Patient declines genetic testing at this time. Offered genetic counseling also and she declines this. She'll followup with me if she chooses. SBE monthly reviewed 2. Pap smear 2012. No Pap smear done today. History of LGSIL 2005  with negative colposcopy and normal Pap smears since then. Plan repeat Pap smear next year 3 year interval. 3. History of endometriosis status post right ovarian cystectomy and fulguration of endometriosis 12/2011. Has done well without significant pain. We'll continue to monitor. 4. Health maintenance. Baseline CBC compressive metabolic panel lipid profile urinalysis ordered. Followup one year, sooner if she decides to pursue genetic testing or genetic counselor referral.  Note: This document was prepared with digital dictation and possible smart phrase technology. Any transcriptional errors that result from this process are unintentional.   Dara Lords MD, 4:20 PM 11/18/2012

## 2012-11-19 LAB — URINALYSIS W MICROSCOPIC + REFLEX CULTURE
Bilirubin Urine: NEGATIVE
Casts: NONE SEEN
Crystals: NONE SEEN
Glucose, UA: NEGATIVE mg/dL
Leukocytes, UA: NEGATIVE
Squamous Epithelial / LPF: NONE SEEN
pH: 7.5 (ref 5.0–8.0)

## 2012-11-19 LAB — COMPREHENSIVE METABOLIC PANEL
ALT: 20 U/L (ref 0–35)
AST: 20 U/L (ref 0–37)
Albumin: 4.5 g/dL (ref 3.5–5.2)
BUN: 13 mg/dL (ref 6–23)
CO2: 27 mEq/L (ref 19–32)
Calcium: 9.8 mg/dL (ref 8.4–10.5)
Chloride: 100 mEq/L (ref 96–112)
Potassium: 3.8 mEq/L (ref 3.5–5.3)

## 2012-11-19 LAB — LIPID PANEL: Cholesterol: 223 mg/dL — ABNORMAL HIGH (ref 0–200)

## 2012-11-20 ENCOUNTER — Other Ambulatory Visit: Payer: Self-pay | Admitting: Gynecology

## 2012-11-20 DIAGNOSIS — E7800 Pure hypercholesterolemia, unspecified: Secondary | ICD-10-CM

## 2013-05-09 ENCOUNTER — Other Ambulatory Visit: Payer: Self-pay

## 2013-05-09 DIAGNOSIS — Z1231 Encounter for screening mammogram for malignant neoplasm of breast: Secondary | ICD-10-CM

## 2013-06-13 ENCOUNTER — Encounter (INDEPENDENT_AMBULATORY_CARE_PROVIDER_SITE_OTHER): Payer: Self-pay

## 2013-06-13 ENCOUNTER — Ambulatory Visit
Admission: RE | Admit: 2013-06-13 | Discharge: 2013-06-13 | Disposition: A | Discharge status: Home / Self Care | Payer: BC Managed Care – PPO | Source: Ambulatory Visit

## 2013-06-13 DIAGNOSIS — Z1231 Encounter for screening mammogram for malignant neoplasm of breast: Secondary | ICD-10-CM

## 2013-08-10 ENCOUNTER — Ambulatory Visit (INDEPENDENT_AMBULATORY_CARE_PROVIDER_SITE_OTHER): Payer: BC Managed Care – PPO | Admitting: Internal Medicine

## 2013-08-10 VITALS — BP 130/90 | HR 88 | Temp 99.6°F | Resp 16 | Ht 67.5 in | Wt 159.0 lb

## 2013-08-10 DIAGNOSIS — S61209A Unspecified open wound of unspecified finger without damage to nail, initial encounter: Secondary | ICD-10-CM

## 2013-08-10 DIAGNOSIS — Z23 Encounter for immunization: Secondary | ICD-10-CM

## 2013-08-10 NOTE — Progress Notes (Signed)
Chief Complaint  Patient presents with  . Finger Laceration    Right index, cut it last night while making dinner   Presents with complaint of finger laceration. Was cooking last night and sliced the tip of her index finger Bleeding controlled by pressure over night but bleeding again this morning his dressing changed Unsure of last tetanus  Exam There is a 0.8 cm round area where the tip was sliced off--includes small margin of nail  Bone not exposed No tissue flap remains  Impression  Fingertip laceration Soap and water to clean twice a day/dress with Xeroform until well Tdap

## 2014-01-16 ENCOUNTER — Encounter: Payer: Self-pay | Admitting: Gynecology

## 2014-01-16 ENCOUNTER — Ambulatory Visit (INDEPENDENT_AMBULATORY_CARE_PROVIDER_SITE_OTHER): Payer: BC Managed Care – PPO | Admitting: Gynecology

## 2014-01-16 DIAGNOSIS — R197 Diarrhea, unspecified: Secondary | ICD-10-CM

## 2014-01-16 DIAGNOSIS — R1084 Generalized abdominal pain: Secondary | ICD-10-CM

## 2014-01-16 LAB — URINALYSIS W MICROSCOPIC + REFLEX CULTURE
BILIRUBIN URINE: NEGATIVE
CASTS: NONE SEEN
CRYSTALS: NONE SEEN
GLUCOSE, UA: NEGATIVE mg/dL
KETONES UR: NEGATIVE mg/dL
Leukocytes, UA: NEGATIVE
Nitrite: NEGATIVE
Protein, ur: NEGATIVE mg/dL
SPECIFIC GRAVITY, URINE: 1.02 (ref 1.005–1.030)
SQUAMOUS EPITHELIAL / LPF: NONE SEEN
UROBILINOGEN UA: 0.2 mg/dL (ref 0.0–1.0)
WBC UA: NONE SEEN WBC/hpf (ref <3)
pH: 6 (ref 5.0–8.0)

## 2014-01-16 LAB — COMPREHENSIVE METABOLIC PANEL
ALBUMIN: 3.4 g/dL — AB (ref 3.5–5.2)
ALK PHOS: 54 U/L (ref 39–117)
ALT: 14 U/L (ref 0–35)
AST: 14 U/L (ref 0–37)
BILIRUBIN TOTAL: 0.3 mg/dL (ref 0.2–1.2)
BUN: 6 mg/dL (ref 6–23)
CO2: 28 meq/L (ref 19–32)
Calcium: 8.8 mg/dL (ref 8.4–10.5)
Chloride: 104 mEq/L (ref 96–112)
Creat: 0.68 mg/dL (ref 0.50–1.10)
GLUCOSE: 89 mg/dL (ref 70–99)
POTASSIUM: 3.5 meq/L (ref 3.5–5.3)
SODIUM: 141 meq/L (ref 135–145)
TOTAL PROTEIN: 5.8 g/dL — AB (ref 6.0–8.3)

## 2014-01-16 NOTE — Progress Notes (Signed)
PATTY LEITZKE Oct 29, 1970 [id number]        43 y.o.  [id number] Presents with a three-week history of sharp diffuse lower abdominal pain, diarrhea, lack of appetite. Some nausea. No vomiting. No fever or chills, rashes or insect bites. No travel or other family members sick. No history of same before. No dysuria frequency urgency weight loss or other constitutional symptoms.  Past medical history,surgical history, problem list, medications, allergies, family history and social history were all reviewed and documented in the EPIC chart.  Directed ROS with pertinent positives and negatives documented in the history of present illness/assessment and plan.  Exam: Kim assistant General appearance:  Normal HEENT normal. Lungs clear Cardiac regular rate no rubs murmurs gallops Abdomen soft nontender without masses guarding rebound. Active bowel sounds throughout. Pelvic external BUS vagina normal. Cervix normal. Uterus normal size midline mobile nontender. Adnexa without masses or tenderness. Rectal exam is normal.  Assessment/Plan:  43 y.o. [id number] with 3 weeks of diarrhea and lower abdominal discomfort lack of appetite low-grade nausea. No other symptoms.  No travel out of country or into rural areas. Initial urinalysis shows 11-20 RBC no WBC few bacteria but also yeast. Suspect contaminated. I asked her to repeat a clean catch urine today and we'll see what that is. Will check baseline labs to include CBC comprehensive metabolic panel stool culture and C. Difficile. Recommend trial of probiotics.  Follow up for lab results and ultimately gastroenterology referral if continues.     Anastasio Auerbach MD, 2:45 PM 01/16/2014

## 2014-01-16 NOTE — Patient Instructions (Signed)
Follow-up for lab results

## 2014-01-16 NOTE — Addendum Note (Signed)
Addended by: Nelva Nay on: 01/16/2014 03:54 PM   Modules accepted: Orders

## 2014-01-17 LAB — CBC WITH DIFFERENTIAL/PLATELET
Basophils Absolute: 0 10*3/uL (ref 0.0–0.1)
Basophils Relative: 0 % (ref 0–1)
Eosinophils Absolute: 0.1 10*3/uL (ref 0.0–0.7)
Eosinophils Relative: 1 % (ref 0–5)
HEMATOCRIT: 40.3 % (ref 36.0–46.0)
HEMOGLOBIN: 13.7 g/dL (ref 12.0–15.0)
LYMPHS ABS: 2.7 10*3/uL (ref 0.7–4.0)
Lymphocytes Relative: 28 % (ref 12–46)
MCH: 30.1 pg (ref 26.0–34.0)
MCHC: 34 g/dL (ref 30.0–36.0)
MCV: 88.6 fL (ref 78.0–100.0)
MONOS PCT: 9 % (ref 3–12)
MPV: 9.1 fL — AB (ref 9.4–12.4)
Monocytes Absolute: 0.9 10*3/uL (ref 0.1–1.0)
NEUTROS ABS: 6 10*3/uL (ref 1.7–7.7)
NEUTROS PCT: 62 % (ref 43–77)
Platelets: 380 10*3/uL (ref 150–400)
RBC: 4.55 MIL/uL (ref 3.87–5.11)
RDW: 13.1 % (ref 11.5–15.5)
WBC: 9.7 10*3/uL (ref 4.0–10.5)

## 2014-01-18 LAB — URINE CULTURE: Colony Count: 4000

## 2014-01-20 LAB — STOOL CULTURE

## 2014-01-23 ENCOUNTER — Other Ambulatory Visit: Payer: Self-pay | Admitting: Gynecology

## 2014-01-23 LAB — CLOSTRIDIUM DIFFICILE CULTURE-FECAL

## 2014-01-23 MED ORDER — METRONIDAZOLE 500 MG PO TABS: 500.0000 mg | ORAL_TABLET | Freq: Three times a day (TID) | ORAL | Status: DC

## 2014-01-24 LAB — CLOSTRIDIUM DIFFICILE TOXIN: C diff Toxin A: DETECTED — AB

## 2014-01-26 ENCOUNTER — Telehealth: Payer: Self-pay | Admitting: Gynecology

## 2014-01-26 NOTE — Telephone Encounter (Signed)
On December 11 patient was called to inform her that her recent stool culture as a result of her abdominal cramping and diarrhea that was ordered on December 4 came back positive Clostridium difficile. She denied any fever. She was prescribed Flagyl 500 mg 1 by mouth 3 times a day for 10 days. She was advised to drink a I balance fluids such as Gatorade and use Imodium when necessary. Patient stated that several weeks prior she had been on antibiotic for sinus infection. I explained to her that this may have been more triggered her C. difficile colitis/diarrhea picture.  On December 14 patient was called back to see how she was doing set her diarrhea had improved but she had a mild rash that she attributes to her [employer] decor[employer]. She was instructed to return back to the office to see her primary Doctor Fontaine if her symptoms do not resolve.

## 2014-03-05 ENCOUNTER — Other Ambulatory Visit (HOSPITAL_COMMUNITY)
Admission: RE | Admit: 2014-03-05 | Discharge: 2014-03-05 | Disposition: A | Discharge status: Home / Self Care | Payer: BLUE CROSS/BLUE SHIELD | Source: Ambulatory Visit | Attending: Gynecology | Admitting: Gynecology

## 2014-03-05 ENCOUNTER — Encounter: Payer: Self-pay | Admitting: Gynecology

## 2014-03-05 ENCOUNTER — Ambulatory Visit (INDEPENDENT_AMBULATORY_CARE_PROVIDER_SITE_OTHER): Payer: BLUE CROSS/BLUE SHIELD | Admitting: Gynecology

## 2014-03-05 VITALS — BP 120/76 | Ht 67.0 in | Wt 153.0 lb

## 2014-03-05 DIAGNOSIS — Z1151 Encounter for screening for human papillomavirus (HPV): Secondary | ICD-10-CM | POA: Diagnosis present

## 2014-03-05 DIAGNOSIS — Z01419 Encounter for gynecological examination (general) (routine) without abnormal findings: Secondary | ICD-10-CM

## 2014-03-05 DIAGNOSIS — N926 Irregular menstruation, unspecified: Secondary | ICD-10-CM

## 2014-03-05 LAB — FOLLICLE STIMULATING HORMONE: FSH: 8 m[IU]/mL

## 2014-03-05 LAB — LIPID PANEL
Cholesterol: 188 mg/dL (ref 0–200)
HDL: 60 mg/dL (ref >39)
LDL CALC: 107 mg/dL — AB (ref 0–99)
Total CHOL/HDL Ratio: 3.1 Ratio
Triglycerides: 104 mg/dL (ref <150)
VLDL: 21 mg/dL (ref 0–40)

## 2014-03-05 LAB — PROLACTIN: PROLACTIN: 4.3 ng/mL

## 2014-03-05 LAB — TSH: TSH: 0.858 u[IU]/mL (ref 0.350–4.500)

## 2014-03-05 NOTE — Progress Notes (Signed)
LACI FRENKEL [birth year] [id number]        44 y.o.  [id number] for annual exam.  Several issues noted below.  Past medical history,surgical history, problem list, medications, allergies, family history and social history were all reviewed and documented as reviewed in the EPIC chart.  ROS:  Performed with pertinent positives and negatives included in the history, assessment and plan.   Additional significant findings :  none   Exam: Kim Counsellor Vitals:   03/05/14 1523  BP: 120/76  Height: _0  (1.702 m)  Weight: 153 lb (69.4 kg)   General appearance:  Normal affect, orientation and appearance. Skin: Grossly normal HEENT: Without gross lesions.  No cervical or supraclavicular adenopathy. Thyroid normal.  Lungs:  Clear without wheezing, rales or rhonchi Cardiac: RR, without RMG Abdominal:  Soft, nontender, without masses, guarding, rebound, organomegaly or hernia Breasts:  Examined lying and sitting without masses, retractions, discharge or axillary adenopathy. Pelvic:  Ext/BUS/vagina normal  Cervix normal  Uterus anteverted, normal size, shape and contour, midline and mobile nontender   Adnexa  Without masses or tenderness    Anus and perineum  Normal   Rectovaginal  Normal sphincter tone without palpated masses or tenderness.    Assessment/Plan:  44 y.o. [id number] female for annual exam with irregular menses, vasectomy birth control.   1. Irregular menses. Patient notes over the last several months her menses have become more irregular ranging from every 6 weeks to 2 weeks. Like a regular menses when occurs. No prolonged or atypical bleeding. No other symptoms such as hot flashes or night sweats. Will check baseline labs to include [place] TSH prolactin. Reviewed ovulatory irregularity and options to regulate such as low-dose oral contraceptives, progesterone only withdrawals, Mirena IUD. At this point patient does not wish intervention but prefers observation. Will call if  prolonged or atypical bleeding. 2. Recent history of C. Difficile. Treated with Flagyl. Symptoms have totally resolved. Need to alert physicians if she has diarrhea following any antibiotic treatment ASAP. 3. Pap smear 2012. Pap/HPV today. History of LGSIL 2005 with negative Pap smears since then. 4. Mammography 06/2013. Continue with annual mammography. Maternal history of breast cancer at age 4. Sister tested and negative. We have discussed BRCA testing in the past and she has always declined but at this point she now wants to go ahead and be tested. Recommend appointment with genetic counselor and she agrees to follow up for this. 5. Health maintenance. Recently has had a normal CBC and comprehensive metabolic panel. Check lipid profile now with urinalysis with her above hormone studies. Follow up with appointment with genetic counselor otherwise annually. Sooner if irregular menses continue or worsen.     Anastasio Auerbach MD, 3:54 PM 03/05/2014

## 2014-03-05 NOTE — Addendum Note (Signed)
Addended by: Nelva Nay on: 03/05/2014 04:05 PM   Modules accepted: Orders

## 2014-03-05 NOTE — Patient Instructions (Signed)
Office will call you to arrange genetic counseling as far as breast cancer history. If you do not hear from the office in one week, call the office.  You may obtain a copy of any labs that were done today by logging onto MyChart as outlined in the instructions provided with your AVS (after visit summary). The office will not call with normal lab results but certainly if there are any significant abnormalities then we will contact you.   Health Maintenance, Female A healthy lifestyle and preventative care can promote health and wellness.  Maintain regular health, dental, and eye exams.  Eat a healthy diet. Foods like vegetables, fruits, whole grains, low-fat dairy products, and lean protein foods contain the nutrients you need without too many calories. Decrease your intake of foods high in solid fats, added sugars, and salt. Get information about a proper diet from your caregiver, if necessary.  Regular physical exercise is one of the most important things you can do for your health. Most adults should get at least 150 minutes of moderate-intensity exercise (any activity that increases your heart rate and causes you to sweat) each week. In addition, most adults need muscle-strengthening exercises on 2 or more days a week.   Maintain a healthy weight. The body mass index (BMI) is a screening tool to identify possible weight problems. It provides an estimate of body fat based on height and weight. Your caregiver can help determine your BMI, and can help you achieve or maintain a healthy weight. For adults 20 years and older:  A BMI below 18.5 is considered underweight.  A BMI of 18.5 to 24.9 is normal.  A BMI of 25 to 29.9 is considered overweight.  A BMI of 30 and above is considered obese.  Maintain normal blood lipids and cholesterol by exercising and minimizing your intake of saturated fat. Eat a balanced diet with plenty of fruits and vegetables. Blood tests for lipids and cholesterol  should begin at age 107 and be repeated every 5 years. If your lipid or cholesterol levels are high, you are over 50, or you are a high risk for heart disease, you may need your cholesterol levels checked more frequently.Ongoing high lipid and cholesterol levels should be treated with medicines if diet and exercise are not effective.  If you smoke, find out from your caregiver how to quit. If you do not use tobacco, do not start.  Lung cancer screening is recommended for adults aged 76 80 years who are at high risk for developing lung cancer because of a history of smoking. Yearly low-dose computed tomography (CT) is recommended for people who have at least a 30-pack-year history of smoking and are a current smoker or have quit within the past 15 years. A pack year of smoking is smoking an average of 1 pack of cigarettes a day for 1 year (for example: 1 pack a day for 30 years or 2 packs a day for 15 years). Yearly screening should continue until the smoker has stopped smoking for at least 15 years. Yearly screening should also be stopped for people who develop a health problem that would prevent them from having lung cancer treatment.  If you are pregnant, do not drink alcohol. If you are breastfeeding, be very cautious about drinking alcohol. If you are not pregnant and choose to drink alcohol, do not exceed 1 drink per day. One drink is considered to be 12 ounces (355 mL) of beer, 5 ounces (148 mL) of wine, or  1.5 ounces (44 mL) of liquor.  Avoid use of street drugs. Do not share needles with anyone. Ask for help if you need support or instructions about stopping the use of drugs.  High blood pressure causes heart disease and increases the risk of stroke. Blood pressure should be checked at least every 1 to 2 years. Ongoing high blood pressure should be treated with medicines, if weight loss and exercise are not effective.  If you are 58 to 45 years old, ask your caregiver if you should take aspirin  to prevent strokes.  Diabetes screening involves taking a blood sample to check your fasting blood sugar level. This should be done once every 3 years, after age 66, if you are within normal weight and without risk factors for diabetes. Testing should be considered at a younger age or be carried out more frequently if you are overweight and have at least 1 risk factor for diabetes.  Breast cancer screening is essential preventative care for women. You should practice "breast self-awareness." This means understanding the normal appearance and feel of your breasts and may include breast self-examination. Any changes detected, no matter how small, should be reported to a caregiver. Women in their 96s and 30s should have a clinical breast exam (CBE) by a caregiver as part of a regular health exam every 1 to 3 years. After age 22, women should have a CBE every year. Starting at age 2, women should consider having a mammogram (breast X-ray) every year. Women who have a family history of breast cancer should talk to their caregiver about genetic screening. Women at a high risk of breast cancer should talk to their caregiver about having an MRI and a mammogram every year.  Breast cancer gene (BRCA)-related cancer risk assessment is recommended for women who have family members with BRCA-related cancers. BRCA-related cancers include breast, ovarian, tubal, and peritoneal cancers. Having family members with these cancers may be associated with an increased risk for harmful changes (mutations) in the breast cancer genes BRCA1 and BRCA2. Results of the assessment will determine the need for genetic counseling and BRCA1 and BRCA2 testing.  The Pap test is a screening test for cervical cancer. Women should have a Pap test starting at age 36. Between ages 62 and 73, Pap tests should be repeated every 2 years. Beginning at age 46, you should have a Pap test every 3 years as long as the past 3 Pap tests have been normal. If  you had a hysterectomy for a problem that was not cancer or a condition that could lead to cancer, then you no longer need Pap tests. If you are between ages 69 and 22, and you have had normal Pap tests going back 10 years, you no longer need Pap tests. If you have had past treatment for cervical cancer or a condition that could lead to cancer, you need Pap tests and screening for cancer for at least 20 years after your treatment. If Pap tests have been discontinued, risk factors (such as a new sexual partner) need to be reassessed to determine if screening should be resumed. Some women have medical problems that increase the chance of getting cervical cancer. In these cases, your caregiver may recommend more frequent screening and Pap tests.  The human papillomavirus (HPV) test is an additional test that may be used for cervical cancer screening. The HPV test looks for the virus that can cause the cell changes on the cervix. The cells collected during the Pap  test can be tested for HPV. The HPV test could be used to screen women aged 59 years and older, and should be used in women of any age who have unclear Pap test results. After the age of 44, women should have HPV testing at the same frequency as a Pap test.  Colorectal cancer can be detected and often prevented. Most routine colorectal cancer screening begins at the age of 67 and continues through age 69. However, your caregiver may recommend screening at an earlier age if you have risk factors for colon cancer. On a yearly basis, your caregiver may provide home test kits to check for hidden blood in the stool. Use of a small camera at the end of a tube, to directly examine the colon (sigmoidoscopy or colonoscopy), can detect the earliest forms of colorectal cancer. Talk to your caregiver about this at age 64, when routine screening begins. Direct examination of the colon should be repeated every 5 to 10 years through age 6, unless early forms of  pre-cancerous polyps or small growths are found.  Hepatitis C blood testing is recommended for all people born from [birth year] through 1966 and any individual with known risks for hepatitis C.  Practice safe sex. Use condoms and avoid high-risk sexual practices to reduce the spread of sexually transmitted infections (STIs). Sexually active women aged 64 and younger should be checked for Chlamydia, which is a common sexually transmitted infection. Older women with new or multiple partners should also be tested for Chlamydia. Testing for other STIs is recommended if you are sexually active and at increased risk.  Osteoporosis is a disease in which the bones lose minerals and strength with aging. This can result in serious bone fractures. The risk of osteoporosis can be identified using a bone density scan. Women ages 22 and over and women at risk for fractures or osteoporosis should discuss screening with their caregivers. Ask your caregiver whether you should be taking a calcium supplement or vitamin D to reduce the rate of osteoporosis.  Menopause can be associated with physical symptoms and risks. Hormone replacement therapy is available to decrease symptoms and risks. You should talk to your caregiver about whether hormone replacement therapy is right for you.  Use sunscreen. Apply sunscreen liberally and repeatedly throughout the day. You should seek shade when your shadow is shorter than you. Protect yourself by wearing long sleeves, pants, a wide-brimmed hat, and sunglasses year round, whenever you are outdoors.  Notify your caregiver of new moles or changes in moles, especially if there is a change in shape or color. Also notify your caregiver if a mole is larger than the size of a pencil eraser.  Stay current with your immunizations. Document Released: 08/15/2010 Document Revised: 05/27/2012 Document Reviewed: 08/15/2010 [facility] Patient Information 2014 [place].

## 2014-03-06 LAB — URINALYSIS W MICROSCOPIC + REFLEX CULTURE
BACTERIA UA: NONE SEEN
Bilirubin Urine: NEGATIVE
Casts: NONE SEEN
Crystals: NONE SEEN
Glucose, UA: NEGATIVE mg/dL
HGB URINE DIPSTICK: NEGATIVE
Ketones, ur: NEGATIVE mg/dL
LEUKOCYTES UA: NEGATIVE
NITRITE: NEGATIVE
PROTEIN: NEGATIVE mg/dL
SQUAMOUS EPITHELIAL / LPF: NONE SEEN
Specific Gravity, Urine: 1.014 (ref 1.005–1.030)
UROBILINOGEN UA: 0.2 mg/dL (ref 0.0–1.0)
pH: 7 (ref 5.0–8.0)

## 2014-03-10 LAB — CYTOLOGY - PAP

## 2014-05-21 ENCOUNTER — Other Ambulatory Visit: Payer: Self-pay

## 2014-05-21 DIAGNOSIS — Z1231 Encounter for screening mammogram for malignant neoplasm of breast: Secondary | ICD-10-CM

## 2014-06-23 ENCOUNTER — Ambulatory Visit
Admission: RE | Admit: 2014-06-23 | Discharge: 2014-06-23 | Disposition: A | Discharge status: Home / Self Care | Payer: BLUE CROSS/BLUE SHIELD | Source: Ambulatory Visit

## 2014-06-23 DIAGNOSIS — Z1231 Encounter for screening mammogram for malignant neoplasm of breast: Secondary | ICD-10-CM

## 2015-06-03 ENCOUNTER — Encounter: Payer: Self-pay | Admitting: Gynecology

## 2015-06-03 ENCOUNTER — Telehealth: Payer: Self-pay | Admitting: *Deleted

## 2015-06-03 ENCOUNTER — Ambulatory Visit (INDEPENDENT_AMBULATORY_CARE_PROVIDER_SITE_OTHER): Payer: Managed Care, Other (non HMO) | Admitting: Gynecology

## 2015-06-03 VITALS — BP 130/80 | Ht 67.0 in | Wt 166.0 lb

## 2015-06-03 DIAGNOSIS — Z803 Family history of malignant neoplasm of breast: Secondary | ICD-10-CM

## 2015-06-03 DIAGNOSIS — Z01419 Encounter for gynecological examination (general) (routine) without abnormal findings: Secondary | ICD-10-CM

## 2015-06-03 DIAGNOSIS — IMO0002 Reserved for concepts with insufficient information to code with codable children: Secondary | ICD-10-CM

## 2015-06-03 DIAGNOSIS — Z13220 Encounter for screening for lipoid disorders: Secondary | ICD-10-CM | POA: Diagnosis not present

## 2015-06-03 LAB — LIPID PANEL
Cholesterol: 222 mg/dL — ABNORMAL HIGH (ref 125–200)
HDL: 56 mg/dL (ref >=46)
LDL CALC: 135 mg/dL — AB (ref <130)
Total CHOL/HDL Ratio: 4 Ratio (ref <=5.0)
Triglycerides: 156 mg/dL — ABNORMAL HIGH (ref <150)
VLDL: 31 mg/dL — ABNORMAL HIGH (ref <30)

## 2015-06-03 LAB — COMPREHENSIVE METABOLIC PANEL
ALK PHOS: 67 U/L (ref 33–115)
ALT: 17 U/L (ref 6–29)
AST: 17 U/L (ref 10–35)
Albumin: 4.2 g/dL (ref 3.6–5.1)
BILIRUBIN TOTAL: 0.4 mg/dL (ref 0.2–1.2)
BUN: 10 mg/dL (ref 7–25)
CALCIUM: 9.6 mg/dL (ref 8.6–10.2)
CO2: 25 mmol/L (ref 20–31)
CREATININE: 0.75 mg/dL (ref 0.50–1.10)
Chloride: 102 mmol/L (ref 98–110)
GLUCOSE: 127 mg/dL — AB (ref 65–99)
Potassium: 3.8 mmol/L (ref 3.5–5.3)
SODIUM: 137 mmol/L (ref 135–146)
Total Protein: 7.1 g/dL (ref 6.1–8.1)

## 2015-06-03 NOTE — Progress Notes (Signed)
    Amanda Haynes 18-Nov-1970 [id number]        45 y.o.  [id number]  for annual exam.  Doing well. Several issues noted below.  Past medical history,surgical history, problem list, medications, allergies, family history and social history were all reviewed and documented as reviewed in the EPIC chart.  ROS:  Performed with pertinent positives and negatives included in the history, assessment and plan.   Additional significant findings :  none   Exam: Amanda Haynes assistant Filed Vitals:   06/03/15 1356  BP: 130/80  Height: 5\' 7"  (1.702 m)  Weight: 166 lb (75.297 kg)   General appearance:  Normal affect, orientation and appearance. Skin: Grossly normal HEENT: Without gross lesions.  No cervical or supraclavicular adenopathy. Thyroid normal.  Lungs:  Clear without wheezing, rales or rhonchi Cardiac: RR, without RMG Abdominal:  Soft, nontender, without masses, guarding, rebound, organomegaly or hernia Breasts:  Examined lying and sitting without masses, retractions, discharge or axillary adenopathy. Pelvic:  Ext/BUS/vagina normal  Cervix normal. Pap smear done  Uterus anteverted, normal size, shape and contour, midline and mobile nontender   Adnexa without masses or tenderness    Anus and perineum normal   Rectovaginal normal sphincter tone without palpated masses or tenderness.    Assessment/Plan:  45 y.o. [id number] female for annual exam with regular menses, vasectomy contraception.   1. Mammography 06/2014. Patient will call to schedule her mammography. Strong family history of breast cancer at age 47 and her mother. Sister was tested and negative. The patient and I discussed this and she was going to discuss with genetic counselor last year but this was never arranged. She wants to go ahead and arrange it this year. She knows to call my office if she does not hear from the genetic counselor within 2 weeks. 2. History of irregular menses last year. Menses are now regular. 3. Pap  smear/HPV 2016. 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 Pap smear done today. 4. Health maintenance. Baseline CBC, CMP, lipid profile, urinalysis ordered. Follow up in one year, sooner as needed.   Amanda Auerbach MD, 2:29 PM 06/03/2015

## 2015-06-03 NOTE — Addendum Note (Signed)
Addended by: Nelva Nay on: 06/03/2015 02:45 PM   Modules accepted: Orders, SmartSet

## 2015-06-03 NOTE — Telephone Encounter (Signed)
-----   Message from Anastasio Auerbach, MD sent at 06/03/2015  2:31 PM EDT ----- Arrange oncology genetic counseling reference mother with breast cancer at age 46

## 2015-06-03 NOTE — Patient Instructions (Signed)
Office will call you to arrange for genetic counseling. Call my office if you do not hear from them within 2 weeks.  You may obtain a copy of any labs that were done today by logging onto MyChart as outlined in the instructions provided with your AVS (after visit summary). The office will not call with normal lab results but certainly if there are any significant abnormalities then we will contact you.   Health Maintenance Adopting a healthy lifestyle and getting preventive care can go a long way to promote health and wellness. Talk with your health care provider about what schedule of regular examinations is right for you. This is a good chance for you to check in with your provider about disease prevention and staying healthy. In between checkups, there are plenty of things you can do on your own. Experts have done a lot of research about which lifestyle changes and preventive measures are most likely to keep you healthy. Ask your health care provider for more information. WEIGHT AND DIET  Eat a healthy diet  Be sure to include plenty of vegetables, fruits, low-fat dairy products, and lean protein.  Do not eat a lot of foods high in solid fats, added sugars, or salt.  Get regular exercise. This is one of the most important things you can do for your health.  Most adults should exercise for at least 150 minutes each week. The exercise should increase your heart rate and make you sweat (moderate-intensity exercise).  Most adults should also do strengthening exercises at least twice a week. This is in addition to the moderate-intensity exercise.  Maintain a healthy weight  Body mass index (BMI) is a measurement that can be used to identify possible weight problems. It estimates body fat based on height and weight. Your health care provider can help determine your BMI and help you achieve or maintain a healthy weight.  For females 16 years of age and older:   A BMI below 18.5 is considered  underweight.  A BMI of 18.5 to 24.9 is normal.  A BMI of 25 to 29.9 is considered overweight.  A BMI of 30 and above is considered obese.  Watch levels of cholesterol and blood lipids  You should start having your blood tested for lipids and cholesterol at 46 years of age, then have this test every 5 years.  You may need to have your cholesterol levels checked more often if:  Your lipid or cholesterol levels are high.  You are older than 46 years of age.  You are at high risk for heart disease.  CANCER SCREENING   Lung Cancer  Lung cancer screening is recommended for adults 74-21 years old who are at high risk for lung cancer because of a history of smoking.  A yearly low-dose CT scan of the lungs is recommended for people who:  Currently smoke.  Have quit within the past 15 years.  Have at least a 30-pack-year history of smoking. A pack year is smoking an average of one pack of cigarettes a day for 1 year.  Yearly screening should continue until it has been 15 years since you quit.  Yearly screening should stop if you develop a health problem that would prevent you from having lung cancer treatment.  Breast Cancer  Practice breast self-awareness. This means understanding how your breasts normally appear and feel.  It also means doing regular breast self-exams. Let your health care provider know about any changes, no matter how small.  If you are in your 20s or 30s, you should have a clinical breast exam (CBE) by a health care provider every 1-3 years as part of a regular health exam.  If you are 44 or older, have a CBE every year. Also consider having a breast X-ray (mammogram) every year.  If you have a family history of breast cancer, talk to your health care provider about genetic screening.  If you are at high risk for breast cancer, talk to your health care provider about having an MRI and a mammogram every year.  Breast cancer gene (BRCA) assessment is  recommended for women who have family members with BRCA-related cancers. BRCA-related cancers include:  Breast.  Ovarian.  Tubal.  Peritoneal cancers.  Results of the assessment will determine the need for genetic counseling and BRCA1 and BRCA2 testing. Cervical Cancer Routine pelvic examinations to screen for cervical cancer are no longer recommended for nonpregnant women who are considered low risk for cancer of the pelvic organs (ovaries, uterus, and vagina) and who do not have symptoms. A pelvic examination may be necessary if you have symptoms including those associated with pelvic infections. Ask your health care provider if a screening pelvic exam is right for you.   The Pap test is the screening test for cervical cancer for women who are considered at risk.  If you had a hysterectomy for a problem that was not cancer or a condition that could lead to cancer, then you no longer need Pap tests.  If you are older than 65 years, and you have had normal Pap tests for the past 10 years, you no longer need to have Pap tests.  If you have had past treatment for cervical cancer or a condition that could lead to cancer, you need Pap tests and screening for cancer for at least 20 years after your treatment.  If you no longer get a Pap test, assess your risk factors if they change (such as having a new sexual partner). This can affect whether you should start being screened again.  Some women have medical problems that increase their chance of getting cervical cancer. If this is the case for you, your health care provider may recommend more frequent screening and Pap tests.  The human papillomavirus (HPV) test is another test that may be used for cervical cancer screening. The HPV test looks for the virus that can cause cell changes in the cervix. The cells collected during the Pap test can be tested for HPV.  The HPV test can be used to screen women 64 years of age and older. Getting tested  for HPV can extend the interval between normal Pap tests from three to five years.  An HPV test also should be used to screen women of any age who have unclear Pap test results.  After 46 years of age, women should have HPV testing as often as Pap tests.  Colorectal Cancer  This type of cancer can be detected and often prevented.  Routine colorectal cancer screening usually begins at 46 years of age and continues through 46 years of age.  Your health care provider may recommend screening at an earlier age if you have risk factors for colon cancer.  Your health care provider may also recommend using home test kits to check for hidden blood in the stool.  A small camera at the end of a tube can be used to examine your colon directly (sigmoidoscopy or colonoscopy). This is done to check for  the earliest forms of colorectal cancer.  Routine screening usually begins at age 68.  Direct examination of the colon should be repeated every 5-10 years through 46 years of age. However, you may need to be screened more often if early forms of precancerous polyps or small growths are found. Skin Cancer  Check your skin from head to toe regularly.  Tell your health care provider about any new moles or changes in moles, especially if there is a change in a mole's shape or color.  Also tell your health care provider if you have a mole that is larger than the size of a pencil eraser.  Always use sunscreen. Apply sunscreen liberally and repeatedly throughout the day.  Protect yourself by wearing long sleeves, pants, a wide-brimmed hat, and sunglasses whenever you are outside. HEART DISEASE, DIABETES, AND HIGH BLOOD PRESSURE   Have your blood pressure checked at least every 1-2 years. High blood pressure causes heart disease and increases the risk of stroke.  If you are between 52 years and 26 years old, ask your health care provider if you should take aspirin to prevent strokes.  Have regular  diabetes screenings. This involves taking a blood sample to check your fasting blood sugar level.  If you are at a normal weight and have a low risk for diabetes, have this test once every three years after 46 years of age.  If you are overweight and have a high risk for diabetes, consider being tested at a younger age or more often. PREVENTING INFECTION  Hepatitis B  If you have a higher risk for hepatitis B, you should be screened for this virus. You are considered at high risk for hepatitis B if:  You were born in a country where hepatitis B is common. Ask your health care provider which countries are considered high risk.  Your parents were born in a high-risk country, and you have not been immunized against hepatitis B (hepatitis B vaccine).  You have HIV or AIDS.  You use needles to inject street drugs.  You live with someone who has hepatitis B.  You have had sex with someone who has hepatitis B.  You get hemodialysis treatment.  You take certain medicines for conditions, including cancer, organ transplantation, and autoimmune conditions. Hepatitis C  Blood testing is recommended for:  Everyone born from [birth year] through 1966.  Anyone with known risk factors for hepatitis C. Sexually transmitted infections (STIs)  You should be screened for sexually transmitted infections (STIs) including gonorrhea and chlamydia if:  You are sexually active and are younger than 46 years of age.  You are older than 46 years of age and your health care provider tells you that you are at risk for this type of infection.  Your sexual activity has changed since you were last screened and you are at an increased risk for chlamydia or gonorrhea. Ask your health care provider if you are at risk.  If you do not have HIV, but are at risk, it may be recommended that you take a prescription medicine daily to prevent HIV infection. This is called pre-exposure prophylaxis (PrEP). You are considered at  risk if:  You are sexually active and do not regularly use condoms or know the HIV status of your partner(s).  You take drugs by injection.  You are sexually active with a partner who has HIV. Talk with your health care provider about whether you are at high risk of being infected with HIV. If you  choose to begin PrEP, you should first be tested for HIV. You should then be tested every 3 months for as long as you are taking PrEP.  PREGNANCY   If you are premenopausal and you may become pregnant, ask your health care provider about preconception counseling.  If you may become pregnant, take 400 to 800 micrograms (mcg) of folic acid every day.  If you want to prevent pregnancy, talk to your health care provider about birth control (contraception). OSTEOPOROSIS AND MENOPAUSE   Osteoporosis is a disease in which the bones lose minerals and strength with aging. This can result in serious bone fractures. Your risk for osteoporosis can be identified using a bone density scan.  If you are 9 years of age or older, or if you are at risk for osteoporosis and fractures, ask your health care provider if you should be screened.  Ask your health care provider whether you should take a calcium or vitamin D supplement to lower your risk for osteoporosis.  Menopause may have certain physical symptoms and risks.  Hormone replacement therapy may reduce some of these symptoms and risks. Talk to your health care provider about whether hormone replacement therapy is right for you.  HOME CARE INSTRUCTIONS   Schedule regular health, dental, and eye exams.  Stay current with your immunizations.   Do not use any tobacco products including cigarettes, chewing tobacco, or electronic cigarettes.  If you are pregnant, do not drink alcohol.  If you are breastfeeding, limit how much and how often you drink alcohol.  Limit alcohol intake to no more than 1 drink per day for nonpregnant women. One drink equals  12 ounces of beer, 5 ounces of wine, or 1 ounces of hard liquor.  Do not use street drugs.  Do not share needles.  Ask your health care provider for help if you need support or information about quitting drugs.  Tell your health care provider if you often feel depressed.  Tell your health care provider if you have ever been abused or do not feel safe at home. Document Released: 08/15/2010 Document Revised: 06/16/2013 Document Reviewed: 01/01/2013 [facility] Patient Information 2015 [place], [place]. This information is not intended to replace advice given to you by your health care provider. Make sure you discuss any questions you have with your health care provider.

## 2015-06-03 NOTE — Telephone Encounter (Signed)
Referral placed they will contact pt to schedule. 

## 2015-06-04 ENCOUNTER — Encounter: Payer: Self-pay | Admitting: Gynecology

## 2015-06-04 LAB — URINALYSIS W MICROSCOPIC + REFLEX CULTURE
BILIRUBIN URINE: NEGATIVE
Bacteria, UA: NONE SEEN [HPF]
CRYSTALS: NONE SEEN [HPF]
Casts: NONE SEEN [LPF]
GLUCOSE, UA: NEGATIVE
Hgb urine dipstick: NEGATIVE
KETONES UR: NEGATIVE
LEUKOCYTES UA: NEGATIVE
NITRITE: NEGATIVE
PH: 7 (ref 5.0–8.0)
Protein, ur: NEGATIVE
SPECIFIC GRAVITY, URINE: 1.018 (ref 1.001–1.035)
Squamous Epithelial / LPF: NONE SEEN [HPF] (ref <=5)
WBC UA: NONE SEEN WBC/HPF (ref <=5)
Yeast: NONE SEEN [HPF]

## 2015-06-04 LAB — CBC WITH DIFFERENTIAL/PLATELET
BASOS PCT: 0 %
Basophils Absolute: 0 cells/uL (ref 0–200)
EOS PCT: 1 %
Eosinophils Absolute: 88 cells/uL (ref 15–500)
HEMATOCRIT: 39.4 % (ref 35.0–45.0)
HEMOGLOBIN: 13.3 g/dL (ref 11.7–15.5)
LYMPHS ABS: 2640 {cells}/uL (ref 850–3900)
Lymphocytes Relative: 30 %
MCH: 30.6 pg (ref 27.0–33.0)
MCHC: 33.8 g/dL (ref 32.0–36.0)
MCV: 90.6 fL (ref 80.0–100.0)
MONO ABS: 528 {cells}/uL (ref 200–950)
MPV: 9 fL (ref 7.5–12.5)
Monocytes Relative: 6 %
Neutro Abs: 5544 cells/uL (ref 1500–7800)
Neutrophils Relative %: 63 %
Platelets: 266 10*3/uL (ref 140–400)
RBC: 4.35 MIL/uL (ref 3.80–5.10)
RDW: 12.8 % (ref 11.0–15.0)
WBC: 8.8 10*3/uL (ref 3.8–10.8)

## 2015-06-05 LAB — URINE CULTURE
COLONY COUNT: NO GROWTH
ORGANISM ID, BACTERIA: NO GROWTH

## 2015-06-07 LAB — PAP IG W/ RFLX HPV ASCU

## 2015-06-08 ENCOUNTER — Other Ambulatory Visit: Payer: Self-pay | Admitting: Gynecology

## 2015-06-08 DIAGNOSIS — R7309 Other abnormal glucose: Secondary | ICD-10-CM

## 2015-06-08 DIAGNOSIS — E7800 Pure hypercholesterolemia, unspecified: Secondary | ICD-10-CM

## 2015-06-08 DIAGNOSIS — E782 Mixed hyperlipidemia: Secondary | ICD-10-CM

## 2015-06-08 DIAGNOSIS — R3129 Other microscopic hematuria: Secondary | ICD-10-CM

## 2015-06-14 ENCOUNTER — Telehealth: Payer: Self-pay | Admitting: Genetic Counselor

## 2015-06-14 NOTE — Telephone Encounter (Signed)
Attempted to contact pt but unable to leave message due to full vm

## 2015-06-22 ENCOUNTER — Other Ambulatory Visit: Payer: Managed Care, Other (non HMO)

## 2015-06-22 DIAGNOSIS — E782 Mixed hyperlipidemia: Secondary | ICD-10-CM

## 2015-06-22 DIAGNOSIS — E7800 Pure hypercholesterolemia, unspecified: Secondary | ICD-10-CM

## 2015-06-22 DIAGNOSIS — R3129 Other microscopic hematuria: Secondary | ICD-10-CM

## 2015-06-22 DIAGNOSIS — R7309 Other abnormal glucose: Secondary | ICD-10-CM

## 2015-06-22 LAB — HEMOGLOBIN A1C
HEMOGLOBIN A1C: 5.4 % (ref <5.7)
MEAN PLASMA GLUCOSE: 108 mg/dL

## 2015-06-23 LAB — URINALYSIS W MICROSCOPIC + REFLEX CULTURE
BILIRUBIN URINE: NEGATIVE
Bacteria, UA: NONE SEEN [HPF]
CRYSTALS: NONE SEEN [HPF]
Casts: NONE SEEN [LPF]
Glucose, UA: NEGATIVE
Ketones, ur: NEGATIVE
LEUKOCYTES UA: NEGATIVE
Nitrite: NEGATIVE
Protein, ur: NEGATIVE
RBC / HPF: NONE SEEN RBC/HPF (ref <=2)
SPECIFIC GRAVITY, URINE: 1.019 (ref 1.001–1.035)
Squamous Epithelial / LPF: NONE SEEN [HPF] (ref <=5)
WBC UA: NONE SEEN WBC/HPF (ref <=5)
YEAST: NONE SEEN [HPF]
pH: 5.5 (ref 5.0–8.0)

## 2015-06-23 LAB — LIPID PANEL
CHOLESTEROL: 207 mg/dL — AB (ref 125–200)
HDL: 51 mg/dL (ref >=46)
LDL Cholesterol: 120 mg/dL (ref <130)
Total CHOL/HDL Ratio: 4.1 Ratio (ref <=5.0)
Triglycerides: 182 mg/dL — ABNORMAL HIGH (ref <150)
VLDL: 36 mg/dL — ABNORMAL HIGH (ref <30)

## 2015-06-23 LAB — GLUCOSE, FASTING: Glucose, Fasting: 94 mg/dL (ref 65–99)

## 2015-07-09 NOTE — Telephone Encounter (Signed)
Cancer center attempted to contact several times pt but unable to leave message due to full vm.

## 2015-09-01 ENCOUNTER — Other Ambulatory Visit: Payer: Self-pay | Admitting: Gynecology

## 2015-09-01 DIAGNOSIS — Z1231 Encounter for screening mammogram for malignant neoplasm of breast: Secondary | ICD-10-CM

## 2015-09-13 ENCOUNTER — Ambulatory Visit
Admission: RE | Admit: 2015-09-13 | Discharge: 2015-09-13 | Disposition: A | Discharge status: Home / Self Care | Payer: Managed Care, Other (non HMO) | Source: Ambulatory Visit | Attending: Gynecology | Admitting: Gynecology

## 2015-09-13 DIAGNOSIS — Z1231 Encounter for screening mammogram for malignant neoplasm of breast: Secondary | ICD-10-CM

## 2015-10-25 ENCOUNTER — Ambulatory Visit (INDEPENDENT_AMBULATORY_CARE_PROVIDER_SITE_OTHER): Payer: Managed Care, Other (non HMO) | Admitting: Physician Assistant

## 2015-10-25 VITALS — BP 122/78 | HR 74 | Temp 98.1°F | Resp 17 | Ht 67.0 in | Wt 155.8 lb

## 2015-10-25 DIAGNOSIS — R509 Fever, unspecified: Secondary | ICD-10-CM | POA: Diagnosis not present

## 2015-10-25 LAB — POCT CBC
Granulocyte percent: 70.7 % (ref 37–80)
HCT, POC: 38.4 % (ref 37.7–47.9)
Hemoglobin: 13.5 g/dL (ref 12.2–16.2)
Lymph, poc: 2.1 (ref 0.6–3.4)
MCH, POC: 31.3 pg — AB (ref 27–31.2)
MCHC: 35.2 g/dL (ref 31.8–35.4)
MCV: 88.9 fL (ref 80–97)
MID (cbc): 0.2 (ref 0–0.9)
MPV: 7.1 fL (ref 0–99.8)
POC Granulocyte: 5.5 (ref 2–6.9)
POC LYMPH PERCENT: 26.9 %L (ref 10–50)
POC MID %: 2.4 % (ref 0–12)
Platelet Count, POC: 264 10*3/uL (ref 142–424)
RBC: 4.31 M/uL (ref 4.04–5.48)
RDW, POC: 13.9 %
WBC: 7.8 10*3/uL (ref 4.6–10.2)

## 2015-10-25 NOTE — Patient Instructions (Addendum)
     IF you received an x-ray today, you will receive an invoice from [facility]. Please contact [facility] at [phone] with questions or concerns regarding your invoice.   IF you received labwork today, you will receive an invoice from [employer]. Please contact [facility] at [phone] with questions or concerns regarding your invoice.   Our billing staff will not be able to assist you with questions regarding bills from these companies.  You will be contacted with the lab results as soon as they are available. The fastest way to get your results is to activate your My Chart account. Instructions are located on the last page of this paperwork. If you have not heard from [place] regarding the results in 2 weeks, please contact this office.     Fever, Adult A fever is an increase in the body's temperature. It is usually defined as a temperature of 100F (38C) or higher. Brief mild or moderate fevers generally have no long-term effects, and they often do not require treatment. Moderate or high fevers may make you feel uncomfortable and can sometimes be a sign of a serious illness or disease. The sweating that may occur with repeated or prolonged fever may also cause dehydration. Fever is confirmed by taking a temperature with a thermometer. A measured temperature can vary with:  Age.  Time of day.  Location of the thermometer:  Mouth (oral).  Rectum (rectal).  Ear (tympanic).  Underarm (axillary).  Forehead (temporal). HOME CARE INSTRUCTIONS Pay attention to any changes in your symptoms. Take these actions to help with your condition:  Take over-the counter and prescription medicines only as told by your health care provider. Follow the dosing instructions carefully.  If you were prescribed an antibiotic medicine, take it as told by your health care provider. Do not stop taking the antibiotic even if you start to feel better.  Rest  as needed.  Drink enough fluid to keep your urine clear or pale yellow. This helps to prevent dehydration.  Sponge yourself or bathe with room-temperature water to help reduce your body temperature as needed. Do not use ice water.  Do not overbundle yourself in blankets or heavy clothes. SEEK MEDICAL CARE IF:  You vomit.  You cannot eat or drink without vomiting.  You have diarrhea.  You have pain when you urinate.  Your symptoms do not improve with treatment.  You develop new symptoms.  You develop excessive weakness. SEEK IMMEDIATE MEDICAL CARE IF:  You have shortness of breath or have trouble breathing.  You are dizzy or you faint.  You are disoriented or confused.  You develop signs of dehydration, such as a dry mouth, decreased urination, or paleness.  You develop severe pain in your abdomen.  You have persistent vomiting or diarrhea.  You develop a skin rash.  Your symptoms suddenly get worse.   This information is not intended to replace advice given to you by your health care provider. Make sure you discuss any questions you have with your health care provider.   Document Released: 07/26/2000 Document Revised: 10/21/2014 Document Reviewed: 03/26/2014 Elsevier Interactive Patient Education [employer].

## 2015-10-25 NOTE — Progress Notes (Signed)
Amanda Haynes  MRN: [id number] DOB: Mar 10, 1970  PCP: Anastasio Auerbach, MD  Subjective:  Pt is a 45 year old female, no reported past medical history, presenting to clinic for fever, headache, sore throat x 6 days. She has noted a fever of 101 intermittently the last 6 days, she takes Advil, feels better for a few hours then the fever returns. Also complains of minor sore throat, fatigue and headache.   She is sleeping well, does not wake up at night due to sore throat or fever. No loss of appetite, no weight loss.  Denies cough, runny nose, sinus pressure, chest pain. No known sick contacts.   Review of Systems  Constitutional: Positive for fatigue and fever. Negative for appetite change, chills and diaphoresis.  HENT: Positive for sore throat. Negative for congestion, ear pain, postnasal drip, rhinorrhea, sinus pressure, sneezing and voice change.   Respiratory: Negative for cough, chest tightness and shortness of breath.   Cardiovascular: Negative.   Gastrointestinal: Negative.   Genitourinary: Negative for dysuria, hematuria, pelvic pain, urgency, vaginal bleeding and vaginal discharge.  Musculoskeletal: Negative for arthralgias, myalgias, neck pain and neck stiffness.  Skin: Negative for rash.    There are no active problems to display for this patient.   Current Outpatient Prescriptions on File Prior to Visit  Medication Sig Dispense Refill  . Calcium Carbonate-Vitamin D (CALCIUM + D PO) Take by mouth.    Marland Kitchen ibuprofen (ADVIL,MOTRIN) 200 MG tablet Take 200 mg by mouth daily as needed. For pain    . Multiple Vitamin (MULTIVITAMIN WITH MINERALS) TABS Take 1 tablet by mouth daily.    . Probiotic Product (PROBIOTIC PO) Take by mouth.     No current facility-administered medications on file prior to visit.     No Known Allergies  Objective:  BP 122/78 (BP Location: Right Arm, Patient Position: Sitting, Cuff Size: Normal)   Pulse 74   Temp 98.1 F (36.7 C) (Oral)    Resp 17   Ht 5\' 7"  (1.702 m)   Wt 155 lb 12.8 oz (70.7 kg)   LMP 10/18/2015 (Approximate)   SpO2 100%   BMI 24.40 kg/m   Physical Exam  Constitutional: She is oriented to person, place, and time and well-developed, well-nourished, and in no distress. No distress.  HENT:  Mouth/Throat: Mucous membranes are normal. Posterior oropharyngeal edema present. No oropharyngeal exudate or posterior oropharyngeal erythema.  Cardiovascular: Normal rate, regular rhythm and normal heart sounds.   Pulmonary/Chest: Effort normal and breath sounds normal. No respiratory distress. She has no wheezes. She has no rales.  Abdominal: Soft. Bowel sounds are normal. She exhibits no distension and no mass. There is no tenderness.  Neurological: She is alert and oriented to person, place, and time. GCS score is 15.  Skin: Skin is warm and dry.  Psychiatric: Mood, memory, affect and judgment normal.  Vitals reviewed.  Results for orders placed or performed in visit on 10/25/15  POCT CBC  Result Value Ref Range   WBC 7.8 4.6 - 10.2 K/uL   Lymph, poc 2.1 0.6 - 3.4   POC LYMPH PERCENT 26.9 10 - 50 %L   MID (cbc) 0.2 0 - 0.9   POC MID % 2.4 0 - 12 %M   POC Granulocyte 5.5 2 - 6.9   Granulocyte percent 70.7 37 - 80 %G   RBC 4.31 4.04 - 5.48 M/uL   Hemoglobin 13.5 12.2 - 16.2 g/dL   HCT, POC 38.4 37.7 - 47.9 %  MCV 88.9 80 - 97 fL   MCH, POC 31.3 (A) 27 - 31.2 pg   MCHC 35.2 31.8 - 35.4 g/dL   RDW, POC 13.9 %   Platelet Count, POC 264 142 - 424 K/uL   MPV 7.1 0 - 99.8 fL    Assessment and Plan :  1. Fever, unspecified fever cause - POCT CBC - Supportive care discussed with patient. She will RTC in one week if no improvement.   Mercer Pod, PA-C  [facility] 10/25/2015 5:32 PM

## 2015-12-31 ENCOUNTER — Telehealth: Payer: Self-pay | Admitting: *Deleted

## 2015-12-31 ENCOUNTER — Encounter: Payer: Self-pay | Admitting: Gynecology

## 2015-12-31 ENCOUNTER — Ambulatory Visit (INDEPENDENT_AMBULATORY_CARE_PROVIDER_SITE_OTHER): Payer: Managed Care, Other (non HMO) | Admitting: Gynecology

## 2015-12-31 VITALS — BP 120/76

## 2015-12-31 DIAGNOSIS — N63 Unspecified lump in unspecified breast: Secondary | ICD-10-CM | POA: Diagnosis not present

## 2015-12-31 DIAGNOSIS — N644 Mastodynia: Secondary | ICD-10-CM

## 2015-12-31 DIAGNOSIS — Z803 Family history of malignant neoplasm of breast: Secondary | ICD-10-CM

## 2015-12-31 NOTE — Progress Notes (Signed)
    MIKESHA COMP 02-May-1970 [id number]        45 y.o.  [id number] presents noting most recent sharp stabbing pains in her right breast with thickening on self-exam. No history of same before. Recent normal screening mammogram 08/2015. Family history of breast cancer to include her mother at age 30. Sister was genetically tested and negative. We attempted to arrange for genetic counseling with the patient but she did not follow up with this.  Past medical history,surgical history, problem list, medications, allergies, family history and social history were all reviewed and documented in the EPIC chart.  Directed ROS with pertinent positives and negatives documented in the history of present illness/assessment and plan.  Exam: [name] Vitals:   12/31/15 1035  BP: 120/76   General appearance:  Normal Both breast examined lying and sitting without masses retractions discharge adenopathy. The the area of tenderness and thickening to the patient's self-exam is at the 4:00 position right breast at the periphery with junction at the sternum.  Assessment/Plan:  45 y.o. [id number] newly perceived discomfort and thickening on self-exam. Physician exam is normal. Recent screening mammogram normal. Will schedule ultrasound and diagnostic mammography at radiologist's discretion after reviewing the screening mammogram. Patient knows to expect a call from the radiology department and if she does not hear from them within a week she will call my office. I again discussed her strong family history of breast cancer in the potential for genetic linkage and she now agrees to following up with the genetic counselors and we will reconsult them. She knows to call us if she does not hear from them within the next 2 weeks.    Anastasio Auerbach MD, 10:48 AM 12/31/2015

## 2015-12-31 NOTE — Telephone Encounter (Addendum)
-----   Message from Anastasio Auerbach, MD sent at 12/31/2015 10:47 AM EST ----- 1. Arrange appointment with oncology genetic counselors reference strong family history of breast cancer to include premenopausal.  2. Also arrange for ultrasound and possible diagnostic mammography right breast. Patient has recent screening mammogram 08/2015. Request diagnostic mammography at radiologist's discretion. Reference pain and tissue thickening at 4:00 right breast periphery junction of sternum to patient self-exam. Physician exam is normal. Strong family history of breast cancer

## 2015-12-31 NOTE — Telephone Encounter (Signed)
Referral placed genetic counselors will contact pt to schedule as well as breast center.

## 2015-12-31 NOTE — Patient Instructions (Signed)
The breast center will call you to arrange for the ultrasound and possible mammogram  The genetic counselors will call you to arrange for an appointment.

## 2016-01-03 NOTE — Telephone Encounter (Signed)
Pt informed with # 2 appointment.

## 2016-01-03 NOTE — Telephone Encounter (Signed)
Appointment on 01/10/16 @ 11:20pm check in , left message for pt to call

## 2016-01-05 ENCOUNTER — Encounter: Payer: Self-pay | Admitting: Genetic Counselor

## 2016-01-05 ENCOUNTER — Telehealth: Payer: Self-pay | Admitting: Genetic Counselor

## 2016-01-05 NOTE — Telephone Encounter (Signed)
Pt confirmed appt, verified demo and insurance, mailed pt letter, in basket appt date/time.

## 2016-01-10 ENCOUNTER — Ambulatory Visit
Admission: RE | Admit: 2016-01-10 | Discharge: 2016-01-10 | Disposition: A | Discharge status: Home / Self Care | Payer: Managed Care, Other (non HMO) | Source: Ambulatory Visit | Attending: Gynecology | Admitting: Gynecology

## 2016-01-10 DIAGNOSIS — N644 Mastodynia: Secondary | ICD-10-CM

## 2016-01-10 NOTE — Telephone Encounter (Signed)
1. 02/17/16@ 2pm with Jeanine Luz & pt is aware of appointment.

## 2016-01-17 ENCOUNTER — Other Ambulatory Visit: Payer: Self-pay | Admitting: Gynecology

## 2016-02-15 ENCOUNTER — Encounter: Payer: Self-pay | Admitting: Genetic Counselor

## 2016-02-15 ENCOUNTER — Telehealth: Payer: Self-pay | Admitting: Genetic Counselor

## 2016-02-15 NOTE — Telephone Encounter (Signed)
Pt confirmed and rescheduled appt for 3/12. Pt letter mailed of new appt.

## 2016-02-17 ENCOUNTER — Other Ambulatory Visit: Payer: Managed Care, Other (non HMO)

## 2016-02-17 ENCOUNTER — Encounter: Payer: Managed Care, Other (non HMO) | Admitting: Genetic Counselor

## 2016-04-21 ENCOUNTER — Encounter: Payer: Self-pay | Admitting: Genetic Counselor

## 2016-04-24 ENCOUNTER — Other Ambulatory Visit: Payer: Managed Care, Other (non HMO)

## 2016-04-24 ENCOUNTER — Encounter: Payer: Managed Care, Other (non HMO) | Admitting: Genetic Counselor

## 2016-06-28 ENCOUNTER — Encounter: Payer: Self-pay | Admitting: Gynecology

## 2017-01-09 ENCOUNTER — Other Ambulatory Visit: Payer: Self-pay | Admitting: Gynecology

## 2017-01-09 DIAGNOSIS — Z1231 Encounter for screening mammogram for malignant neoplasm of breast: Secondary | ICD-10-CM

## 2017-02-08 ENCOUNTER — Ambulatory Visit
Admission: RE | Admit: 2017-02-08 | Discharge: 2017-02-08 | Disposition: A | Discharge status: Home / Self Care | Payer: Managed Care, Other (non HMO) | Source: Ambulatory Visit | Attending: Gynecology | Admitting: Gynecology

## 2017-02-08 DIAGNOSIS — Z1231 Encounter for screening mammogram for malignant neoplasm of breast: Secondary | ICD-10-CM

## 2017-02-23 ENCOUNTER — Ambulatory Visit (INDEPENDENT_AMBULATORY_CARE_PROVIDER_SITE_OTHER): Payer: Managed Care, Other (non HMO) | Admitting: Gynecology

## 2017-02-23 ENCOUNTER — Encounter: Payer: Self-pay | Admitting: Gynecology

## 2017-02-23 VITALS — BP 118/76 | Ht 67.0 in | Wt 162.0 lb

## 2017-02-23 DIAGNOSIS — Z13220 Encounter for screening for lipoid disorders: Secondary | ICD-10-CM | POA: Diagnosis not present

## 2017-02-23 DIAGNOSIS — Z01419 Encounter for gynecological examination (general) (routine) without abnormal findings: Secondary | ICD-10-CM

## 2017-02-23 NOTE — Progress Notes (Signed)
    Amanda Haynes 11-25-1970 [id number]        47 y.o.  [id number] for annual gynecologic exam.  Doing well without GYN complaints.  Past medical history,surgical history, problem list, medications, allergies, family history and social history were all reviewed and documented as reviewed in the EPIC chart.  ROS:  Performed with pertinent positives and negatives included in the history, assessment and plan.   Additional significant findings : None   Exam: Amanda Haynes assistant Vitals:   02/23/17 1545  BP: 118/76  Weight: 162 lb (73.5 kg)  Height: 5\' 7"  (1.702 m)   Body mass index is 25.37 kg/m.  General appearance:  Normal affect, orientation and appearance. Skin: Grossly normal HEENT: Without gross lesions.  No cervical or supraclavicular adenopathy. Thyroid normal.  Lungs:  Clear without wheezing, rales or rhonchi Cardiac: RR, without RMG Abdominal:  Soft, nontender, without masses, guarding, rebound, organomegaly or hernia Breasts:  Examined lying and sitting without masses, retractions, discharge or axillary adenopathy. Pelvic:  Ext, BUS, Vagina: Normal  Cervix: Normal.  Pap smear done  Uterus: Anteverted, normal size, shape and contour, midline and mobile nontender   Adnexa: Without masses or tenderness    Anus and perineum: Normal   Rectovaginal: Normal sphincter tone without palpated masses or tenderness.    Assessment/Plan:  47 y.o. [id number] female for annual gynecologic exam with regular menses, vasectomy birth control..   1. Mammography 01/2017.  Breast exam normal today.  History of breast cancer in her mother at age 77.  Sister genetically tested and negative.  Patient was to arrange genetic counseling appointment last year but this was canceled several times due to the weather and she never followed up.  She does want to go ahead and do this and will make arrangements for her now.  Continue with annual mammography when due. 2. Pap smear 2017.  Pap smear done  today.  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 3. Health maintenance.  Requests baseline labs.  CBC, CMP, lipid profile, urine analysis ordered.  Follow-up in 1 year, sooner as needed.   Amanda Auerbach MD, 4:10 PM 02/23/2017

## 2017-02-23 NOTE — Patient Instructions (Signed)
Office will call to arrange for genetic counseling.  Call if you do not hear from their office within the next 1-2 weeks.  Follow-up in 1 year for annual exam, sooner if any issues.

## 2017-02-23 NOTE — Addendum Note (Signed)
Addended by: Nelva Nay on: 02/23/2017 04:17 PM   Modules accepted: Orders

## 2017-02-24 LAB — URINALYSIS W MICROSCOPIC + REFLEX CULTURE
BILIRUBIN URINE: NEGATIVE
Bacteria, UA: NONE SEEN /HPF
GLUCOSE, UA: NEGATIVE
Hgb urine dipstick: NEGATIVE
Hyaline Cast: NONE SEEN /LPF
Ketones, ur: NEGATIVE
Leukocyte Esterase: NEGATIVE
NITRITES URINE, INITIAL: NEGATIVE
PROTEIN: NEGATIVE
RBC / HPF: NONE SEEN /HPF (ref 0–2)
SPECIFIC GRAVITY, URINE: 1.004 (ref 1.001–1.03)
Squamous Epithelial / LPF: NONE SEEN /HPF (ref <=5)
WBC, UA: NONE SEEN /HPF (ref 0–5)
pH: 7 (ref 5.0–8.0)

## 2017-02-24 LAB — CBC WITH DIFFERENTIAL/PLATELET
BASOS ABS: 62 {cells}/uL (ref 0–200)
Basophils Relative: 0.7 %
EOS ABS: 71 {cells}/uL (ref 15–500)
EOS PCT: 0.8 %
HCT: 37.9 % (ref 35.0–45.0)
Hemoglobin: 13.1 g/dL (ref 11.7–15.5)
Lymphs Abs: 3106 cells/uL (ref 850–3900)
MCH: 30.5 pg (ref 27.0–33.0)
MCHC: 34.6 g/dL (ref 32.0–36.0)
MCV: 88.3 fL (ref 80.0–100.0)
MONOS PCT: 11.3 %
MPV: 9.3 fL (ref 7.5–12.5)
NEUTROS PCT: 52.3 %
Neutro Abs: 4655 cells/uL (ref 1500–7800)
Platelets: 328 10*3/uL (ref 140–400)
RBC: 4.29 10*6/uL (ref 3.80–5.10)
RDW: 12.2 % (ref 11.0–15.0)
TOTAL LYMPHOCYTE: 34.9 %
WBC mixed population: 1006 cells/uL — ABNORMAL HIGH (ref 200–950)
WBC: 8.9 10*3/uL (ref 3.8–10.8)

## 2017-02-24 LAB — COMPREHENSIVE METABOLIC PANEL
AG RATIO: 1.6 (calc) (ref 1.0–2.5)
ALT: 18 U/L (ref 6–29)
AST: 21 U/L (ref 10–35)
Albumin: 4.5 g/dL (ref 3.6–5.1)
Alkaline phosphatase (APISO): 72 U/L (ref 33–115)
BUN: 12 mg/dL (ref 7–25)
CALCIUM: 9.4 mg/dL (ref 8.6–10.2)
CHLORIDE: 102 mmol/L (ref 98–110)
CO2: 27 mmol/L (ref 20–32)
Creat: 0.58 mg/dL (ref 0.50–1.10)
Globulin: 2.8 g/dL (calc) (ref 1.9–3.7)
Glucose, Bld: 88 mg/dL (ref 65–99)
POTASSIUM: 4 mmol/L (ref 3.5–5.3)
Sodium: 137 mmol/L (ref 135–146)
TOTAL PROTEIN: 7.3 g/dL (ref 6.1–8.1)
Total Bilirubin: 0.3 mg/dL (ref 0.2–1.2)

## 2017-02-24 LAB — LIPID PANEL
CHOLESTEROL: 236 mg/dL — AB (ref <200)
HDL: 59 mg/dL (ref >50)
LDL Cholesterol (Calc): 144 mg/dL (calc) — ABNORMAL HIGH
Non-HDL Cholesterol (Calc): 177 mg/dL (calc) — ABNORMAL HIGH (ref <130)
Total CHOL/HDL Ratio: 4 (calc) (ref <5.0)
Triglycerides: 194 mg/dL — ABNORMAL HIGH (ref <150)

## 2017-02-26 ENCOUNTER — Telehealth: Payer: Self-pay | Admitting: Genetics

## 2017-02-26 ENCOUNTER — Telehealth: Payer: Self-pay | Admitting: *Deleted

## 2017-02-26 DIAGNOSIS — Z803 Family history of malignant neoplasm of breast: Secondary | ICD-10-CM

## 2017-02-26 NOTE — Telephone Encounter (Signed)
Referral placed at [facility] cancer center, they will call pt schedule.

## 2017-02-26 NOTE — Telephone Encounter (Signed)
-----   Message from Anastasio Auerbach, MD sent at 02/23/2017  4:13 PM EST ----- Schedule appointment with [facility] genetic counselor reference maternal history of early breast cancer

## 2017-02-26 NOTE — Telephone Encounter (Signed)
Spoke with patient regarding appointment D/T/Loc/P# and she also requested a schedule to be mailed in which I am sending out today.

## 2017-02-27 LAB — PAP IG W/ RFLX HPV ASCU

## 2017-03-01 ENCOUNTER — Other Ambulatory Visit: Payer: Self-pay | Admitting: Gynecology

## 2017-03-01 DIAGNOSIS — E7800 Pure hypercholesterolemia, unspecified: Secondary | ICD-10-CM

## 2017-03-01 DIAGNOSIS — E782 Mixed hyperlipidemia: Secondary | ICD-10-CM

## 2017-03-02 NOTE — Telephone Encounter (Signed)
Pt scheduled on 04/10/17

## 2017-03-13 ENCOUNTER — Telehealth: Payer: Self-pay | Admitting: Genetics

## 2017-03-13 ENCOUNTER — Other Ambulatory Visit: Payer: Managed Care, Other (non HMO)

## 2017-03-13 DIAGNOSIS — E782 Mixed hyperlipidemia: Secondary | ICD-10-CM

## 2017-03-13 DIAGNOSIS — E7800 Pure hypercholesterolemia, unspecified: Secondary | ICD-10-CM

## 2017-03-13 LAB — LIPID PANEL
Cholesterol: 243 mg/dL — ABNORMAL HIGH (ref <200)
HDL: 58 mg/dL (ref >50)
LDL Cholesterol (Calc): 153 mg/dL (calc) — ABNORMAL HIGH
NON-HDL CHOLESTEROL (CALC): 185 mg/dL — AB (ref <130)
Total CHOL/HDL Ratio: 4.2 (calc) (ref <5.0)
Triglycerides: 180 mg/dL — ABNORMAL HIGH (ref <150)

## 2017-03-13 NOTE — Telephone Encounter (Signed)
Returned call to patient as she was requesting appointment to be rescheduled from 2/26 to 3/21. Left message with new date/time

## 2017-04-10 ENCOUNTER — Other Ambulatory Visit: Payer: Managed Care, Other (non HMO)

## 2017-04-10 ENCOUNTER — Encounter: Payer: Managed Care, Other (non HMO) | Admitting: Genetics

## 2017-05-03 ENCOUNTER — Encounter: Payer: Self-pay | Admitting: Genetic Counselor

## 2017-05-03 ENCOUNTER — Inpatient Hospital Stay: Payer: Managed Care, Other (non HMO) | Attending: Genetic Counselor | Admitting: Genetic Counselor

## 2017-05-03 ENCOUNTER — Inpatient Hospital Stay: Payer: Managed Care, Other (non HMO)

## 2017-05-03 DIAGNOSIS — Z803 Family history of malignant neoplasm of breast: Secondary | ICD-10-CM | POA: Insufficient documentation

## 2017-05-03 NOTE — Progress Notes (Signed)
[facility]      Initial Visit   Patient Name: Amanda Haynes Patient DOB: February 07, 1971 Patient Age: 47 y.o. Encounter Date: 05/03/2017  Referring Provider: Anastasio Auerbach, MD   Reason for Visit: Evaluate for hereditary susceptibility to cancer    Assessment and Plan:  . Amanda Haynes' history is not highly suggestive of a hereditary predisposition to cancer, but given her mother's young age at breast cancer diagnosis (age 32), a genetics evaluation is warranted.   . Testing is recommended to determine whether she has a pathogenic mutation that will impact her screening and risk-reduction for cancer. A negative result will be reassuring.  . Amanda Haynes wished to pursue genetic testing and a blood sample will be sent for analysis of the 23 genes on [facility]'s Breast/GYN panel (ATM, BARD1, BRCA1, BRCA2, BRIP1, CDH1, CHEK2, DICER1, EPCAM, MLH1,  MSH2, MSH6, NBN, NF1, PALB2, PMS2, PTEN, RAD50, RAD51C, RAD51D,SMARCA4, STK11, and TP53).   Marland Kitchen Results should be available in approximately 2-4 weeks, at which point we will contact her and address implications for her as well as address genetic testing for at-risk family members, if needed.     Dr. Jana Hakim was available for questions concerning this case. Total time spent by me in face-to-face counseling was approximately 30 minutes.   _____________________________________________________________________   History of Present Illness: Amanda Haynes, a 47 y.o. female, is being seen at the [facility] due to a family history of cancer. She presents to clinic today to discuss the possibility of a hereditary predisposition to cancer and discuss whether genetic testing is warranted.  Amanda Haynes has no personal history of cancer. She currently undergoes a yearly mammogram with tomosynthesis, a yearly clinical breast exam and gynecologic exam.  Past Medical History:  Diagnosis Date  .  Asthma    no inhaler  . Bursitis   . Clostridium difficile diarrhea 2014  . DES exposure in utero   . Family history of breast cancer   . LGSIL (low grade squamous intraepithelial dysplasia) 2005   colposcopy normal, all subsequent Pap smears negative    Past Surgical History:  Procedure Laterality Date  . DILATATION & CURRETTAGE/HYSTEROSCOPY WITH RESECTOCOPE  01/03/2012   Procedure: DILATATION & CURETTAGE/HYSTEROSCOPY WITH RESECTOCOPE;  Surgeon: Anastasio Auerbach, MD;  Location: [facility];  Service: Gynecology;  Laterality: N/A;  start sec [id number]  . LAPAROSCOPY  01/03/2012   Procedure: LAPAROSCOPY OPERATIVE;  Surgeon: Anastasio Auerbach, MD;  Location: [facility];  Service: Gynecology;  Laterality: Right;  right  Ovarian Cystectomy fulguration on endometriosis right ovariann  cyst drainage    . WRIST SURGERY     cyst removed    Social History   Socioeconomic History  . Marital status: Married    Spouse name: Not on file  . Number of children: Not on file  . Years of education: Not on file  . Highest education level: Not on file  Occupational History  . Not on file  Social Needs  . Financial resource strain: Not on file  . Food insecurity:    Worry: Not on file    Inability: Not on file  . Transportation needs:    Medical: Not on file    Non-medical: Not on file  Tobacco Use  . Smoking status: Never Smoker  . Smokeless tobacco: Never Used  Substance and Sexual Activity  . Alcohol use: Yes    Alcohol/week: 4.2 oz  Types: 7 Standard drinks or equivalent per week    Comment: socially  . Drug use: No  . Sexual activity: Yes    Birth control/protection: Other-see comments    Comment: vasectomy-1st intercourse 47 yo--Fewer than 5 partners  Lifestyle  . Physical activity:    Days per week: Not on file    Minutes per session: Not on file  . Stress: Not on file  Relationships  . Social connections:    Talks on phone: Not on file    Gets together: Not on file     Attends religious service: Not on file    Active member of club or organization: Not on file    Attends meetings of clubs or organizations: Not on file    Relationship status: Not on file  Other Topics Concern  . Not on file  Social History Narrative  . Not on file     Family History:  During the visit, a 4-generation pedigree was obtained. Family tree will be scanned in the Media tab in Epic  Significant diagnoses include the following:  Family History  Problem Relation Age of Onset  . Breast cancer Mother 7       deceased 84  . Other Mother        suicide  . Hypertension Father   . Skin cancer Father        non-melanoma; currently 42  . Hypertension Maternal Grandmother   . Diabetes Maternal Grandfather   . Hypertension Maternal Grandfather   . Hypertension Paternal Grandmother   . Uterine cancer Paternal Grandmother        dx 38s; deceased 65s  . Hypertension Paternal Grandfather   . Skin cancer Paternal Grandfather        non-melanoma; deceased 73  . Cancer Other        maternal grandmother- sister with kidney ca and sister with uterine ca    Additionally, Amanda Haynes has 2 sons (ages 54 and 52) and a daughter (age 37). She has a brother (age 71) and a sister (age 41). Her sister reportedly had negative genetic testing, but she did not know how many or which genes were analyzed. Both of her parents were only children.  Amanda Haynes' ancestry is Caucasian - NOS. There is no known Jewish ancestry and no consanguinity.  Discussion: We reviewed the characteristics, features and inheritance patterns of hereditary cancer syndromes. We discussed her risk of harboring a mutation in the context of her personal and family history. We discussed that her small family and paucity of women make risk assessment challenging. We discussed the process of genetic testing, insurance coverage and implications of results: positive, negative and variant of unknown significance (VUS).    Ms.  Haynes questions were answered to her satisfaction today and she is welcome to call with any additional questions or concerns. Thank you for the referral and allowing [place] to share in the care of your patient.    Steele Berg, MS, [place] Certified Genetic Counselor phone: [phone] [email or web address]

## 2017-05-17 ENCOUNTER — Encounter: Payer: Self-pay | Admitting: Genetic Counselor

## 2017-05-17 ENCOUNTER — Ambulatory Visit: Payer: Self-pay | Admitting: Genetic Counselor

## 2017-05-17 DIAGNOSIS — Z1379 Encounter for other screening for genetic and chromosomal anomalies: Secondary | ICD-10-CM

## 2017-05-17 HISTORY — DX: Encounter for other screening for genetic and chromosomal anomalies: Z13.79

## 2017-05-17 NOTE — Progress Notes (Signed)
[facility]       Genetic Test Results    Patient Name: Amanda Haynes Patient DOB: [birth year] Patient Age: 47 y.o. Encounter Date: 05/17/2017  Referring Provider: Anastasio Auerbach, MD  Primary Care Provider: Anastasio Auerbach, MD   Amanda Haynes was called today to discuss genetic test results. Please see the Genetics note from her visit on 05/03/2017 for a detailed discussion of her personal and family history.  Genetic Testing: At the time of Amanda Haynes's visit, she decided to pursue genetic testing of multiple genes associated with hereditary susceptibility to breast and gynecologic cancers. Testing included sequencing and deletion/duplication analysis. Testing did not reveal a pathogenic mutation in any of the genes analyzed.  A copy of the genetic test report will be scanned into Epic under the Media tab.  The genes analyzed were the 23 genes on [facility]'s Breast/GYN panel (ATM, BARD1, BRCA1, BRCA2, BRIP1, CDH1, CHEK2, DICER1, EPCAM, MLH1,  MSH2, MSH6, NBN, NF1, PALB2, PMS2, PTEN, RAD50, RAD51C, RAD51D,SMARCA4, STK11, and TP53).  Since the current test is not perfect, it is possible that there may be a gene mutation that current testing cannot detect, but that chance is small. It is possible that a different genetic factor, which has not yet been discovered or is not on this panel, is responsible for the cancer diagnoses in the family. Again, the likelihood of this is low. No additional testing is recommended at this time for Amanda Haynes.  A Variant of Uncertain Significance was detected: MSH6 c.4000C>T (p.Arg1334Trp). This is still considered a normal result. While at this time, it is unknown if this finding is associated with increased cancer risk, the majority of these variants get reclassified to be inconsequential. Medical management should not be based on this finding. With time, we suspect the lab will determine the significance, if any. If  we do learn more about it, we will try to contact Amanda Haynes to discuss it further. It is important to stay in touch with [place] periodically and keep the address and phone number up to date.  Cancer Screening: These results are reassuring and indicate that Amanda Haynes does not likely have an increased risk of cancer due to a mutation in one of these genes. She is recommended to undergo cancer screenings for individuals in the general population. The [employer]  recommends that women follow the breast screening recommendations below, but that these may need to be modified based on other risk factors such as dense breasts, biopsy history or family history.  Breast awareness - Women should be familiar with their breasts and promptly report changes to their healthcare provider.   Between ages 60-39: Breast exam, risk assessment, and risk reduction counseling by the provider every 1-3 years.   Starting at age 55: Breast exam, risk assessment, and risk reduction counseling by the provider and mammogram every year. The provider may discuss screening with tomosynthesis.  Amanda Haynes is also recommended to undergo a yearly gynecologic exam and initiate colon cancer screening at age 41.  Family Members: Family members are also at some increased risk of developing cancer, over the general population risk, simply due to the family history.  They are recommended to speak with their own providers about appropriate cancer screenings.  Any relative who had cancer at a young age or had a particularly rare cancer may also wish to pursue genetic testing. Genetic counselors  can be located in other cities, by visiting the website of the [employer] (ArtistMovie.se) and Field seismologist for a Dietitian by zip code.   Family members are not recommended to get tested for the above VUS outside of a research protocol as this finding has no implications for their medical  management.  Lastly, cancer genetics is a rapidly advancing field and it is possible that new genetic tests will be appropriate for Amanda Haynes in the future. We encourage her to remain in contact with [place] on an annual basis so we can update her personal and family histories, and let her know of advances in cancer genetics that may benefit the family. Our contact number was provided. Amanda Haynes is welcome to call anytime with additional questions.     Steele Berg, MS, [place] Certified Genetic Counselor phone: [phone]

## 2018-01-04 ENCOUNTER — Other Ambulatory Visit: Payer: Self-pay | Admitting: Gynecology

## 2018-01-04 DIAGNOSIS — Z1231 Encounter for screening mammogram for malignant neoplasm of breast: Secondary | ICD-10-CM

## 2018-02-26 ENCOUNTER — Other Ambulatory Visit: Payer: Self-pay | Admitting: Internal Medicine

## 2018-02-26 DIAGNOSIS — R9431 Abnormal electrocardiogram [ECG] [EKG]: Secondary | ICD-10-CM

## 2018-02-26 DIAGNOSIS — E785 Hyperlipidemia, unspecified: Secondary | ICD-10-CM

## 2018-02-28 ENCOUNTER — Ambulatory Visit
Admission: RE | Admit: 2018-02-28 | Discharge: 2018-02-28 | Disposition: A | Discharge status: Home / Self Care | Payer: Managed Care, Other (non HMO) | Source: Ambulatory Visit | Attending: Gynecology | Admitting: Gynecology

## 2018-02-28 DIAGNOSIS — Z1231 Encounter for screening mammogram for malignant neoplasm of breast: Secondary | ICD-10-CM

## 2018-03-01 ENCOUNTER — Ambulatory Visit
Admission: RE | Admit: 2018-03-01 | Discharge: 2018-03-01 | Disposition: A | Discharge status: Home / Self Care | Payer: Managed Care, Other (non HMO) | Source: Ambulatory Visit | Attending: Internal Medicine | Admitting: Internal Medicine

## 2018-03-01 DIAGNOSIS — E785 Hyperlipidemia, unspecified: Secondary | ICD-10-CM

## 2018-03-01 DIAGNOSIS — R9431 Abnormal electrocardiogram [ECG] [EKG]: Secondary | ICD-10-CM

## 2018-08-13 ENCOUNTER — Other Ambulatory Visit: Payer: Self-pay

## 2018-08-13 ENCOUNTER — Encounter: Payer: Self-pay | Admitting: Gynecology

## 2018-08-13 ENCOUNTER — Ambulatory Visit (INDEPENDENT_AMBULATORY_CARE_PROVIDER_SITE_OTHER): Payer: Managed Care, Other (non HMO) | Admitting: Gynecology

## 2018-08-13 VITALS — BP 118/74 | Ht 67.0 in | Wt 177.0 lb

## 2018-08-13 DIAGNOSIS — N951 Menopausal and female climacteric states: Secondary | ICD-10-CM

## 2018-08-13 DIAGNOSIS — Z9189 Other specified personal risk factors, not elsewhere classified: Secondary | ICD-10-CM

## 2018-08-13 DIAGNOSIS — Z01419 Encounter for gynecological examination (general) (routine) without abnormal findings: Secondary | ICD-10-CM

## 2018-08-13 DIAGNOSIS — N926 Irregular menstruation, unspecified: Secondary | ICD-10-CM

## 2018-08-13 NOTE — Progress Notes (Signed)
    CHYLA SCHLENDER 1970-05-29 [id number]        48 y.o.  [id number] for annual gynecologic exam.  Starting to have irregularity in her cycles where she will skip up to 3 months.  Also starting to have some hot flushes and sweats.  Past medical history,surgical history, problem list, medications, allergies, family history and social history were all reviewed and documented as reviewed in the EPIC chart.  ROS:  Performed with pertinent positives and negatives included in the history, assessment and plan.   Additional significant findings : None   Exam: [name] Vitals:   08/13/18 1208  BP: 118/74  Weight: 177 lb (80.3 kg)  Height: 5\' 7"  (1.702 m)   Body mass index is 27.72 kg/m.  General appearance:  Normal affect, orientation and appearance. Skin: Grossly normal HEENT: Without gross lesions.  No cervical or supraclavicular adenopathy. Thyroid normal.  Lungs:  Clear without wheezing, rales or rhonchi Cardiac: RR, without RMG Abdominal:  Soft, nontender, without masses, guarding, rebound, organomegaly or hernia Breasts:  Examined lying and sitting without masses, retractions, discharge or axillary adenopathy. Pelvic:  Ext, BUS, Vagina: Normal  Cervix: Normal.  Pap smear of cervix and vaginal fornices 360 degrees  Uterus: Anteverted, normal size, shape and contour, midline and mobile nontender   Adnexa: Without masses or tenderness    Anus and perineum: Normal   Rectovaginal: Normal sphincter tone without palpated masses or tenderness.    Assessment/Plan:  48 y.o. [id number] female for annual gynecologic exam.  With irregular menses, vasectomy birth control  1. Irregular menses/menopausal symptoms.  We discussed what to expect in the perimenopause.  Will check [place] TSH for completeness.  Discussed OTC products as well as menstrual monitoring.  As long as less frequent but regular when they occur then will monitor.  If prolonged or atypical bleeding then will follow-up for  further evaluation. 2. Mammography 02/2018.  Strong family history of breast cancer.  Underwent genetic testing this past year with findings of a variant of unknown significance but negative for tested genes.  We will continue with annual mammography when due.  SBE monthly reviewed.  Breast exam normal today. 3. Pap smear 2019.  Pap smear done today.  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  She does have a history of LGSIL 2005 with negative Pap smears since. 4. Health maintenance.  No routine lab work done as patient has establish care with a primary provider and will follow-up with them for this.  Follow-up 1 year, sooner as needed.   Anastasio Auerbach MD, 12:38 PM 08/13/2018

## 2018-08-13 NOTE — Patient Instructions (Signed)
Office will contact you with blood test results  Follow-up in 1 year for annual exam

## 2018-08-13 NOTE — Addendum Note (Signed)
Addended by: Nelva Nay on: 08/13/2018 12:49 PM   Modules accepted: Orders

## 2018-08-14 LAB — PAP IG W/ RFLX HPV ASCU

## 2018-08-14 LAB — TSH: TSH: 1.88 mIU/L

## 2018-08-14 LAB — FOLLICLE STIMULATING HORMONE: FSH: 67.5 m[IU]/mL

## 2018-11-06 ENCOUNTER — Encounter: Payer: Self-pay | Admitting: Gynecology

## 2019-04-23 ENCOUNTER — Other Ambulatory Visit: Payer: Self-pay | Admitting: Obstetrics and Gynecology

## 2019-04-23 DIAGNOSIS — Z1231 Encounter for screening mammogram for malignant neoplasm of breast: Secondary | ICD-10-CM

## 2019-05-13 ENCOUNTER — Other Ambulatory Visit: Payer: Self-pay

## 2019-05-13 ENCOUNTER — Ambulatory Visit
Admission: RE | Admit: 2019-05-13 | Discharge: 2019-05-13 | Disposition: A | Discharge status: Home / Self Care | Payer: 59 | Source: Ambulatory Visit | Attending: Obstetrics and Gynecology | Admitting: Obstetrics and Gynecology

## 2019-05-13 DIAGNOSIS — Z1231 Encounter for screening mammogram for malignant neoplasm of breast: Secondary | ICD-10-CM

## 2019-11-05 LAB — HM PAP SMEAR

## 2020-06-22 ENCOUNTER — Other Ambulatory Visit (HOSPITAL_COMMUNITY)
Admission: RE | Admit: 2020-06-22 | Discharge: 2020-06-22 | Disposition: A | Discharge status: Home / Self Care | Payer: Managed Care, Other (non HMO) | Source: Ambulatory Visit | Attending: Orthopedic Surgery | Admitting: Orthopedic Surgery

## 2020-06-22 DIAGNOSIS — Z01812 Encounter for preprocedural laboratory examination: Secondary | ICD-10-CM | POA: Diagnosis present

## 2020-06-22 DIAGNOSIS — Z20822 Contact with and (suspected) exposure to covid-19: Secondary | ICD-10-CM | POA: Insufficient documentation

## 2020-06-22 LAB — SARS CORONAVIRUS 2 (TAT 6-24 HRS): SARS Coronavirus 2: NEGATIVE

## 2020-06-23 ENCOUNTER — Encounter (HOSPITAL_BASED_OUTPATIENT_CLINIC_OR_DEPARTMENT_OTHER): Payer: Self-pay | Admitting: Orthopedic Surgery

## 2020-06-23 ENCOUNTER — Other Ambulatory Visit: Payer: Self-pay

## 2020-06-23 NOTE — H&P (Signed)
PREOPERATIVE H&P  Chief Complaint: left ankle pain  HPI: Amanda Haynes is a 50 y.o. female who presents for preoperative history and physical with a diagnosis of left distal fibula fracture. On 06-12-20 she was walking in her driveway and fell. She had immediate severe left ankle pain. She could not bear weight on her left foot. She had X-rays taken and was found to have a displaced left fibula fracture. This is significantly impairing activities of daily living.  She has elected for surgical management.   Past Medical History:  Diagnosis Date  . Asthma    exercised induced  . Bursitis   . Clostridium difficile diarrhea 2014  . DES exposure in utero   . Family history of breast cancer   . Genetic testing 05/17/2017   Breast/GYN panel (23 genes) @ [facility] - No pathogenic mutations detected.  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   . High cholesterol   . LGSIL (low grade squamous intraepithelial dysplasia) 2005   colposcopy normal, all subsequent Pap smears negative   Past Surgical History:  Procedure Laterality Date  . DILATATION & CURRETTAGE/HYSTEROSCOPY WITH RESECTOCOPE  01/03/2012   Procedure: DILATATION & CURETTAGE/HYSTEROSCOPY WITH RESECTOCOPE;  Surgeon: Anastasio Auerbach, MD;  Location: [facility];  Service: Gynecology;  Laterality: N/A;  start sec [id number]  . DILATION AND CURETTAGE OF UTERUS    . LAPAROSCOPY  01/03/2012   Procedure: LAPAROSCOPY OPERATIVE;  Surgeon: Anastasio Auerbach, MD;  Location: [facility];  Service: Gynecology;  Laterality: Right;  right  Ovarian Cystectomy fulguration on endometriosis right ovariann  cyst drainage    . WRIST SURGERY     cyst removed   Social History   Socioeconomic History  . Marital status: Married    Spouse name: Not on file  . Number of children: Not on file  . Years of education: Not on file  . Highest education level: Not on file  Occupational History  . Not on file  Tobacco Use  . Smoking  status: Never Smoker  . Smokeless tobacco: Never Used  Vaping Use  . Vaping Use: Never used  Substance and Sexual Activity  . Alcohol use: Yes    Alcohol/week: 7.0 standard drinks    Types: 7 Standard drinks or equivalent per week    Comment: socially  . Drug use: No  . Sexual activity: Yes    Birth control/protection: Other-see comments    Comment: vasectomy-1st intercourse 50 yo--Fewer than 5 partners  Other Topics Concern  . Not on file  Social History Narrative  . Not on file   Social Determinants of Health   Financial Resource Strain: Not on file  Food Insecurity: Not on file  Transportation Needs: Not on file  Physical Activity: Not on file  Stress: Not on file  Social Connections: Not on file   Family History  Problem Relation Age of Onset  . Breast cancer Mother 46       deceased 72  . Other Mother        suicide  . Hypertension Father   . Skin cancer Father        non-melanoma; currently 22  . Hypertension Maternal Grandmother   . Diabetes Maternal Grandfather   . Hypertension Maternal Grandfather   . Hypertension Paternal Grandmother   . Uterine cancer Paternal Grandmother        dx 73s; deceased 23s  . Hypertension Paternal Grandfather   . Skin cancer Paternal Grandfather  non-melanoma; deceased 29  . Cancer Other        maternal grandmother- sister with kidney ca and sister with uterine ca  . Liver disease Brother    No Known Allergies Prior to Admission medications   Medication Sig Start Date End Date Taking? Authorizing Provider  Calcium Carb-Cholecalciferol [phone] MG-UNIT CAPS Take by mouth.   Yes [provider]  ibuprofen (ADVIL,MOTRIN) 200 MG tablet Take 200 mg by mouth daily as needed. For pain   Yes [provider]  MAGNESIUM PO Take by mouth.   Yes [provider]  Multiple Vitamin (MULTIVITAMIN WITH MINERALS) TABS Take 1 tablet by mouth daily.   Yes [provider]  Omega-3 Fatty Acids (FISH OIL)  1000 MG CPDR Take by mouth.   Yes [provider]  Probiotic Product (PROBIOTIC PO) Take by mouth.   Yes [provider]  rosuvastatin (CRESTOR) 5 MG tablet Take 5 mg by mouth daily.   Yes [provider]     Positive ROS: All other systems have been reviewed and were otherwise negative with the exception of those mentioned in the HPI and as above.  Physical Exam: General: Alert, no acute distress Cardiovascular: No pretibial edema. Respiratory: No cyanosis, no use of accessory musculature GI: No organomegaly, abdomen is soft and non-tender Skin: No lesions in the area of chief complaint Neurologic: Sensation intact distally Psychiatric: Patient is competent for consent with normal mood and affect Lymphatic: No axillary or cervical lymphadenopathy  MUSCULOSKELETAL: The left ankle is in a  posterior splint today. She is able to flex and extend all toes. Distal sensation and capillary refill intact. The toes are not edematous.   Assessment: Left distal fibula fracture   Plan: Plan for Procedure(s): OPEN REDUCTION INTERNAL FIXATION (ORIF) DISTAL FIBULA FRACTURE  The risks benefits and alternatives were discussed with the patient including but not limited to the risks of nonoperative treatment, versus surgical intervention including infection, bleeding, nerve injury,  blood clots, cardiopulmonary complications, morbidity, mortality, among others, and they were willing to proceed.    Ventura Bruns, PA-C    06/23/2020 1:57 PM

## 2020-06-25 ENCOUNTER — Ambulatory Visit (HOSPITAL_BASED_OUTPATIENT_CLINIC_OR_DEPARTMENT_OTHER): Payer: Managed Care, Other (non HMO) | Admitting: Certified Registered"

## 2020-06-25 ENCOUNTER — Other Ambulatory Visit: Payer: Self-pay

## 2020-06-25 ENCOUNTER — Encounter (HOSPITAL_BASED_OUTPATIENT_CLINIC_OR_DEPARTMENT_OTHER)
Admission: RE | Disposition: A | Discharge status: Home / Self Care | Payer: Self-pay | Source: Home / Self Care | Attending: Orthopedic Surgery

## 2020-06-25 ENCOUNTER — Ambulatory Visit (HOSPITAL_BASED_OUTPATIENT_CLINIC_OR_DEPARTMENT_OTHER)
Admission: RE | Admit: 2020-06-25 | Discharge: 2020-06-25 | Disposition: A | Discharge status: Home / Self Care | Payer: Managed Care, Other (non HMO) | Attending: Orthopedic Surgery | Admitting: Orthopedic Surgery

## 2020-06-25 ENCOUNTER — Encounter (HOSPITAL_BASED_OUTPATIENT_CLINIC_OR_DEPARTMENT_OTHER): Payer: Self-pay | Admitting: Orthopedic Surgery

## 2020-06-25 DIAGNOSIS — Z8379 Family history of other diseases of the digestive system: Secondary | ICD-10-CM | POA: Diagnosis not present

## 2020-06-25 DIAGNOSIS — Z791 Long term (current) use of non-steroidal anti-inflammatories (NSAID): Secondary | ICD-10-CM | POA: Insufficient documentation

## 2020-06-25 DIAGNOSIS — Z8249 Family history of ischemic heart disease and other diseases of the circulatory system: Secondary | ICD-10-CM | POA: Insufficient documentation

## 2020-06-25 DIAGNOSIS — Z803 Family history of malignant neoplasm of breast: Secondary | ICD-10-CM | POA: Diagnosis not present

## 2020-06-25 DIAGNOSIS — Z79899 Other long term (current) drug therapy: Secondary | ICD-10-CM | POA: Insufficient documentation

## 2020-06-25 DIAGNOSIS — Z833 Family history of diabetes mellitus: Secondary | ICD-10-CM | POA: Diagnosis not present

## 2020-06-25 DIAGNOSIS — W1839XA Other fall on same level, initial encounter: Secondary | ICD-10-CM | POA: Diagnosis not present

## 2020-06-25 DIAGNOSIS — Z8049 Family history of malignant neoplasm of other genital organs: Secondary | ICD-10-CM | POA: Diagnosis not present

## 2020-06-25 DIAGNOSIS — S8262XA Displaced fracture of lateral malleolus of left fibula, initial encounter for closed fracture: Secondary | ICD-10-CM | POA: Insufficient documentation

## 2020-06-25 HISTORY — PX: ORIF FIBULA FRACTURE: SHX5114

## 2020-06-25 SURGERY — OPEN REDUCTION INTERNAL FIXATION (ORIF) FIBULA FRACTURE
Anesthesia: General | Site: Ankle | Laterality: Left

## 2020-06-25 MED ORDER — HYDROMORPHONE HCL 1 MG/ML IJ SOLN: 0.2500 mg | INTRAMUSCULAR | Status: DC | PRN

## 2020-06-25 MED ORDER — FENTANYL CITRATE (PF) 100 MCG/2ML IJ SOLN
100.0000 ug | Freq: Once | INTRAMUSCULAR | Status: AC
Start: 2020-06-25 — End: 2020-06-25
  Administered 2020-06-25: 100 ug via INTRAVENOUS

## 2020-06-25 MED ORDER — MIDAZOLAM HCL 2 MG/2ML IJ SOLN
INTRAMUSCULAR | Status: AC
  Filled 2020-06-25: qty 2

## 2020-06-25 MED ORDER — DEXAMETHASONE SODIUM PHOSPHATE 4 MG/ML IJ SOLN
INTRAMUSCULAR | Status: DC | PRN
  Administered 2020-06-25: 10 mg via INTRAVENOUS

## 2020-06-25 MED ORDER — ACETAMINOPHEN 500 MG PO TABS
ORAL_TABLET | ORAL | Status: AC
  Filled 2020-06-25: qty 2

## 2020-06-25 MED ORDER — MIDAZOLAM HCL 2 MG/2ML IJ SOLN
2.0000 mg | Freq: Once | INTRAMUSCULAR | Status: AC
  Administered 2020-06-25: 2 mg via INTRAVENOUS

## 2020-06-25 MED ORDER — FENTANYL CITRATE (PF) 100 MCG/2ML IJ SOLN
INTRAMUSCULAR | Status: DC | PRN
  Administered 2020-06-25 (×2): 50 ug via INTRAVENOUS

## 2020-06-25 MED ORDER — CEFAZOLIN SODIUM-DEXTROSE 2-4 GM/100ML-% IV SOLN
INTRAVENOUS | Status: AC
  Filled 2020-06-25: qty 100

## 2020-06-25 MED ORDER — AMISULPRIDE (ANTIEMETIC) 5 MG/2ML IV SOLN: 10.0000 mg | Freq: Once | INTRAVENOUS | Status: DC | PRN

## 2020-06-25 MED ORDER — POVIDONE-IODINE 10 % EX SWAB
2.0000 "application " | Freq: Once | CUTANEOUS | Status: AC
  Administered 2020-06-25: 2 via TOPICAL

## 2020-06-25 MED ORDER — MIDAZOLAM HCL 5 MG/5ML IJ SOLN
INTRAMUSCULAR | Status: DC | PRN
  Administered 2020-06-25: 2 mg via INTRAVENOUS

## 2020-06-25 MED ORDER — ROCURONIUM BROMIDE 10 MG/ML (PF) SYRINGE
PREFILLED_SYRINGE | INTRAVENOUS | Status: AC
  Filled 2020-06-25: qty 10

## 2020-06-25 MED ORDER — ONDANSETRON HCL 4 MG/2ML IJ SOLN
INTRAMUSCULAR | Status: DC | PRN
  Administered 2020-06-25: 4 mg via INTRAVENOUS

## 2020-06-25 MED ORDER — PROPOFOL 10 MG/ML IV BOLUS
INTRAVENOUS | Status: DC | PRN
  Administered 2020-06-25: 150 mg via INTRAVENOUS

## 2020-06-25 MED ORDER — PROMETHAZINE HCL 25 MG/ML IJ SOLN: 6.2500 mg | INTRAMUSCULAR | Status: DC | PRN

## 2020-06-25 MED ORDER — LIDOCAINE HCL (CARDIAC) PF 100 MG/5ML IV SOSY
PREFILLED_SYRINGE | INTRAVENOUS | Status: DC | PRN
  Administered 2020-06-25: 20 mg via INTRAVENOUS

## 2020-06-25 MED ORDER — MEPERIDINE HCL 25 MG/ML IJ SOLN: 6.2500 mg | INTRAMUSCULAR | Status: DC | PRN

## 2020-06-25 MED ORDER — DEXAMETHASONE SODIUM PHOSPHATE 10 MG/ML IJ SOLN
INTRAMUSCULAR | Status: AC
  Filled 2020-06-25: qty 1

## 2020-06-25 MED ORDER — KETOROLAC TROMETHAMINE 30 MG/ML IJ SOLN
INTRAMUSCULAR | Status: AC
  Filled 2020-06-25: qty 1

## 2020-06-25 MED ORDER — LIDOCAINE 2% (20 MG/ML) 5 ML SYRINGE
INTRAMUSCULAR | Status: AC
  Filled 2020-06-25: qty 5

## 2020-06-25 MED ORDER — OXYCODONE HCL 5 MG PO TABS: 5.0000 mg | ORAL_TABLET | Freq: Once | ORAL | Status: DC | PRN

## 2020-06-25 MED ORDER — EPHEDRINE SULFATE 50 MG/ML IJ SOLN
INTRAMUSCULAR | Status: DC | PRN
  Administered 2020-06-25: 15 mg via INTRAVENOUS

## 2020-06-25 MED ORDER — FENTANYL CITRATE (PF) 100 MCG/2ML IJ SOLN
INTRAMUSCULAR | Status: AC
  Filled 2020-06-25: qty 2

## 2020-06-25 MED ORDER — ROPIVACAINE HCL 5 MG/ML IJ SOLN
INTRAMUSCULAR | Status: DC | PRN
  Administered 2020-06-25: 30 mL via PERINEURAL

## 2020-06-25 MED ORDER — CEFAZOLIN SODIUM-DEXTROSE 2-4 GM/100ML-% IV SOLN
2.0000 g | INTRAVENOUS | Status: AC
  Administered 2020-06-25: 2 g via INTRAVENOUS

## 2020-06-25 MED ORDER — ACETAMINOPHEN 500 MG PO TABS
1000.0000 mg | ORAL_TABLET | Freq: Once | ORAL | Status: AC
  Administered 2020-06-25: 1000 mg via ORAL

## 2020-06-25 MED ORDER — 0.9 % SODIUM CHLORIDE (POUR BTL) OPTIME
TOPICAL | Status: DC | PRN
  Administered 2020-06-25: 200 mL

## 2020-06-25 MED ORDER — HYDROCODONE-ACETAMINOPHEN 5-325 MG PO TABS: 1.0000 | ORAL_TABLET | Freq: Four times a day (QID) | ORAL | 0 refills | Status: DC | PRN

## 2020-06-25 MED ORDER — LACTATED RINGERS IV SOLN: INTRAVENOUS | Status: DC

## 2020-06-25 MED ORDER — PROPOFOL 10 MG/ML IV BOLUS
INTRAVENOUS | Status: AC
  Filled 2020-06-25: qty 40

## 2020-06-25 MED ORDER — OXYCODONE HCL 5 MG/5ML PO SOLN: 5.0000 mg | Freq: Once | ORAL | Status: DC | PRN

## 2020-06-25 MED ORDER — ONDANSETRON HCL 4 MG/2ML IJ SOLN
INTRAMUSCULAR | Status: AC
  Filled 2020-06-25: qty 2

## 2020-06-25 SURGICAL SUPPLY — 76 items
BANDAGE ESMARK 6X9 LF (GAUZE/BANDAGES/DRESSINGS) ×1 IMPLANT
BIT DRILL 110X2.5XQCK CNCT (BIT) IMPLANT
BIT DRILL 2.5 (BIT) ×2
BIT DRILL QC 110 3.5 (BIT) ×1
BIT DRILL QC 110 3.5MM (BIT) IMPLANT
BIT DRL 110X2.5XQCK CNCT (BIT) ×1
BLADE SURG 15 STRL LF DISP TIS (BLADE) ×2 IMPLANT
BLADE SURG 15 STRL SS (BLADE) ×4
BNDG CMPR 9X6 STRL LF SNTH (GAUZE/BANDAGES/DRESSINGS) ×1
BNDG COHESIVE 4X5 TAN STRL (GAUZE/BANDAGES/DRESSINGS) ×2 IMPLANT
BNDG ELASTIC 4X5.8 VLCR STR LF (GAUZE/BANDAGES/DRESSINGS) ×2 IMPLANT
BNDG ELASTIC 6X5.8 VLCR STR LF (GAUZE/BANDAGES/DRESSINGS) ×1 IMPLANT
BNDG ESMARK 6X9 LF (GAUZE/BANDAGES/DRESSINGS) ×2
CANISTER SUCT 1200ML W/VALVE (MISCELLANEOUS) ×2 IMPLANT
CLSR STERI-STRIP ANTIMIC 1/2X4 (GAUZE/BANDAGES/DRESSINGS) IMPLANT
COVER WAND RF STERILE (DRAPES) IMPLANT
CUFF TOURN SGL QUICK 34 (TOURNIQUET CUFF) ×2
CUFF TRNQT CYL 34X4.125X (TOURNIQUET CUFF) IMPLANT
DECANTER SPIKE VIAL GLASS SM (MISCELLANEOUS) IMPLANT
DRAPE EXTREMITY T 121X128X90 (DISPOSABLE) ×2 IMPLANT
DRAPE INCISE IOBAN 66X45 STRL (DRAPES) IMPLANT
DRAPE OEC MINIVIEW 54X84 (DRAPES) ×2 IMPLANT
DRAPE U-SHAPE 47X51 STRL (DRAPES) ×2 IMPLANT
DRILL BIT QC 110 3.5MM (BIT) ×2
DRSG PAD ABDOMINAL 8X10 ST (GAUZE/BANDAGES/DRESSINGS) ×2 IMPLANT
DURAPREP 26ML APPLICATOR (WOUND CARE) ×2 IMPLANT
ELECT REM PT RETURN 9FT ADLT (ELECTROSURGICAL) ×2
ELECTRODE REM PT RTRN 9FT ADLT (ELECTROSURGICAL) ×1 IMPLANT
GAUZE SPONGE 4X4 12PLY STRL (GAUZE/BANDAGES/DRESSINGS) ×2 IMPLANT
GLOVE SRG 8 PF TXTR STRL LF DI (GLOVE) ×2 IMPLANT
GLOVE SURG ENC MOIS LTX SZ6.5 (GLOVE) ×2 IMPLANT
GLOVE SURG ENC MOIS LTX SZ7 (GLOVE) ×2 IMPLANT
GLOVE SURG ORTHO LTX SZ7.5 (GLOVE) ×2 IMPLANT
GLOVE SURG UNDER POLY LF SZ7 (GLOVE) ×3 IMPLANT
GLOVE SURG UNDER POLY LF SZ8 (GLOVE) ×4
GOWN STRL REUS W/ TWL LRG LVL3 (GOWN DISPOSABLE) ×1 IMPLANT
GOWN STRL REUS W/ TWL XL LVL3 (GOWN DISPOSABLE) ×2 IMPLANT
GOWN STRL REUS W/TWL LRG LVL3 (GOWN DISPOSABLE) ×2
GOWN STRL REUS W/TWL XL LVL3 (GOWN DISPOSABLE) ×4
NS IRRIG 1000ML POUR BTL (IV SOLUTION) ×2 IMPLANT
PACK ARTHROSCOPY DSU (CUSTOM PROCEDURE TRAY) ×2 IMPLANT
PACK BASIN DAY SURGERY FS (CUSTOM PROCEDURE TRAY) ×2 IMPLANT
PAD CAST 4YDX4 CTTN HI CHSV (CAST SUPPLIES) ×1 IMPLANT
PADDING CAST ABS 4INX4YD NS (CAST SUPPLIES)
PADDING CAST ABS COTTON 4X4 ST (CAST SUPPLIES) IMPLANT
PADDING CAST COTTON 4X4 STRL (CAST SUPPLIES) ×2
PADDING CAST COTTON 6X4 STRL (CAST SUPPLIES) IMPLANT
PENCIL SMOKE EVACUATOR (MISCELLANEOUS) ×2 IMPLANT
PLATE SMALL FRAG 3.5X49 4H (Plate) ×1 IMPLANT
SCREW CANC 2.5XFT 12X4XST SM (Screw) IMPLANT
SCREW CANC 2.5XFT 16X4XST SM (Screw) IMPLANT
SCREW CANC 4.0X12 (Screw) ×2 IMPLANT
SCREW CANC 4.0X16 (Screw) ×2 IMPLANT
SCREW CORT S/T 3.5X22 (Screw) ×1 IMPLANT
SCREW CORTICAL 3.5X12 (Screw) ×1 IMPLANT
SCREW CORTICAL 3.5X14 (Screw) ×1 IMPLANT
SLEEVE SCD COMPRESS KNEE MED (STOCKING) ×2 IMPLANT
SPLINT FAST PLASTER 5X30 (CAST SUPPLIES) ×1
SPLINT PLASTER CAST FAST 5X30 (CAST SUPPLIES) ×1 IMPLANT
SPONGE LAP 4X18 RFD (DISPOSABLE) ×2 IMPLANT
STAPLER VISISTAT 35W (STAPLE) IMPLANT
SUCTION FRAZIER HANDLE 10FR (MISCELLANEOUS) ×2
SUCTION TUBE FRAZIER 10FR DISP (MISCELLANEOUS) ×1 IMPLANT
SUT ETHILON 3 0 PS 1 (SUTURE) IMPLANT
SUT ETHILON 4 0 PS 2 18 (SUTURE) IMPLANT
SUT MNCRL AB 4-0 PS2 18 (SUTURE) IMPLANT
SUT VIC AB 0 SH 27 (SUTURE) ×2 IMPLANT
SUT VIC AB 2-0 SH 18 (SUTURE) IMPLANT
SUT VIC AB 2-0 SH 27 (SUTURE) ×2
SUT VIC AB 2-0 SH 27XBRD (SUTURE) IMPLANT
SUT VICRYL 3-0 CR8 SH (SUTURE) ×2 IMPLANT
SUT VICRYL 4-0 PS2 18IN ABS (SUTURE) IMPLANT
SYR BULB EAR ULCER 3OZ GRN STR (SYRINGE) ×2 IMPLANT
TOWEL GREEN STERILE FF (TOWEL DISPOSABLE) ×2 IMPLANT
UNDERPAD 30X36 HEAVY ABSORB (UNDERPADS AND DIAPERS) ×2 IMPLANT
YANKAUER SUCT BULB TIP NO VENT (SUCTIONS) IMPLANT

## 2020-06-25 NOTE — Progress Notes (Signed)
Assisted Dr. Miller with left, ultrasound guided, popliteal block. Side rails up, monitors on throughout procedure. See vital signs in flow sheet. Tolerated Procedure well. 

## 2020-06-25 NOTE — Anesthesia Procedure Notes (Signed)
Anesthesia Regional Block: Popliteal block   Pre-Anesthetic Checklist: ,, timeout performed, Correct Patient, Correct Site, Correct Laterality, Correct Procedure, Correct Position, site marked, Risks and benefits discussed,  Surgical consent,  Pre-op evaluation,  At surgeon's request and post-op pain management  Laterality: Left  Prep: chloraprep       Needles:  Injection technique: Single-shot  Needle Type: Stimiplex     Needle Length: 9cm  Needle Gauge: 21     Additional Needles:   Procedures:,,,, ultrasound used (permanent image in chart),,,,  Narrative:  Start time: 06/25/2020 12:31 PM End time: 06/25/2020 12:36 PM Injection made incrementally with aspirations every 5 mL.  Performed by: Personally  Anesthesiologist: Lynda Rainwater, MD

## 2020-06-25 NOTE — Transfer of Care (Signed)
Immediate Anesthesia Transfer of Care Note  Patient: Amanda Haynes  Procedure(s) Performed: OPEN REDUCTION INTERNAL FIXATION (ORIF) DISTAL FIBULA FRACTURE (Left Ankle)  Patient Location: PACU  Anesthesia Type:General and Regional  Level of Consciousness: awake, alert  and oriented  Airway & Oxygen Therapy: Patient Spontanous Breathing and Patient connected to face mask oxygen  Post-op Assessment: Report given to RN and Post -op Vital signs reviewed and stable  Post vital signs: Reviewed and stable  Last Vitals:  Vitals Value Taken Time  BP    Temp    Pulse    Resp    SpO2      Last Pain:  Vitals:   06/25/20 1208  TempSrc: Oral  PainSc: 4       Patients Stated Pain Goal: 5 (71/69/67 8938)  Complications: No complications documented.

## 2020-06-25 NOTE — Anesthesia Procedure Notes (Signed)
Procedure Name: LMA Insertion Performed by: Raquelle Pietro M, CRNA Pre-anesthesia Checklist: Patient identified, Emergency Drugs available, Suction available and Patient being monitored Patient Re-evaluated:Patient Re-evaluated prior to induction Oxygen Delivery Method: Circle system utilized Preoxygenation: Pre-oxygenation with 100% oxygen Induction Type: IV induction Ventilation: Mask ventilation without difficulty LMA: LMA inserted LMA Size: 4.0 Number of attempts: 1 Airway Equipment and Method: Bite block Placement Confirmation: positive ETCO2 Tube secured with: Tape Dental Injury: Teeth and Oropharynx as per pre-operative assessment        

## 2020-06-25 NOTE — Interval H&P Note (Signed)
History and Physical Interval Note:  06/25/2020 1:01 PM  Amanda Haynes  has presented today for surgery, with the diagnosis of [place].  The various methods of treatment have been discussed with the patient and family. After consideration of risks, benefits and other options for treatment, the patient has consented to  Procedure(s): OPEN REDUCTION INTERNAL FIXATION (ORIF) DISTAL FIBULA FRACTURE (Left) as a surgical intervention.  The patient's history has been reviewed, patient examined, no change in status, stable for surgery.  I have reviewed the patient's chart and labs.  Questions were answered to the patient's satisfaction.     Johnny Bridge

## 2020-06-25 NOTE — Anesthesia Postprocedure Evaluation (Signed)
Anesthesia Post Note  Patient: Amanda Haynes  Procedure(s) Performed: OPEN REDUCTION INTERNAL FIXATION (ORIF) DISTAL FIBULA FRACTURE (Left Ankle)     Patient location during evaluation: Phase II Anesthesia Type: General Level of consciousness: awake Pain management: pain level controlled Vital Signs Assessment: post-procedure vital signs reviewed and stable Respiratory status: spontaneous breathing Cardiovascular status: stable Postop Assessment: no apparent nausea or vomiting Anesthetic complications: no   No complications documented.  Last Vitals:  Vitals:   06/25/20 1449 06/25/20 1500  BP: 129/79 123/73  Pulse: 68 73  Resp: 11 13  Temp: (!) 36.4 C   SpO2: 100% 97%    Last Pain:  Vitals:   06/25/20 1449  TempSrc:   PainSc: 0-No pain                 Huston Foley

## 2020-06-25 NOTE — Anesthesia Preprocedure Evaluation (Signed)
Anesthesia Evaluation  Patient identified by MRN, date of birth, ID band Patient awake    Reviewed: Allergy & Precautions, H&P , NPO status , Patient's Chart, lab work & pertinent test results  Airway Mallampati: I  TM Distance: >3 FB Neck ROM: full    Dental no notable dental hx. (+) Teeth Intact   Pulmonary asthma ,    Pulmonary exam normal - rhonchi       Cardiovascular negative cardio ROS Normal cardiovascular exam     Neuro/Psych negative neurological ROS  negative psych ROS   GI/Hepatic negative GI ROS, Neg liver ROS,   Endo/Other  negative endocrine ROS  Renal/GU negative Renal ROS  negative genitourinary   Musculoskeletal   Abdominal Normal abdominal exam  (+)   Peds  Hematology negative hematology ROS (+)   Anesthesia Other Findings   Reproductive/Obstetrics negative OB ROS                             Anesthesia Physical  Anesthesia Plan  ASA: II  Anesthesia Plan: General   Post-op Pain Management:  Regional for Post-op pain   Induction: Intravenous  PONV Risk Score and Plan: 3 and Ondansetron, Dexamethasone, Midazolam and Treatment may vary due to age or medical condition  Airway Management Planned: LMA  Additional Equipment:   Intra-op Plan:   Post-operative Plan: Extubation in OR  Informed Consent: I have reviewed the patients History and Physical, chart, labs and discussed the procedure including the risks, benefits and alternatives for the proposed anesthesia with the patient or authorized representative who has indicated his/her understanding and acceptance.     Dental Advisory Given  Plan Discussed with: CRNA and Surgeon  Anesthesia Plan Comments:         Anesthesia Quick Evaluation

## 2020-06-25 NOTE — Op Note (Signed)
06/25/2020  PATIENT:  Amanda Haynes    PRE-OPERATIVE DIAGNOSIS:  Left ankle distal fibula fracture  POST-OPERATIVE DIAGNOSIS:  Same  PROCEDURE:    1.  Open Reduction Internal Fixation Fibula 2.  3 views plus a stress view of the left ankle  SURGEON:  Johnny Bridge, MD  PHYSICIAN ASSISTANT: Merlene Pulling, PA-C, present and scrubbed throughout the case, critical for completion in a timely fashion, and for retraction, instrumentation, and closure.  ANESTHESIA:   General  ESTIMATED BLOOD LOSS: minimal  UNIQUE ASPECTS OF THE CASE:  Shortening and displacement of the distal fibular fracture.  The distal screw hit a hard stop and was only a 12, but I suspect I was abutting the peroneal groove inferiorly and I didn't want to drill longer.  It had good fixation.  There was not a 5 hole plate available, and I had excellent purchase with the two above and below plus the interpositional screw.    PREOPERATIVE INDICATIONS:  [name] is a  50 y.o. female with a diagnosis of [place] who elected for surgical management to minimize the risk for malunion and nonunion and post-traumatic arthritis.    The risks benefits and alternatives were discussed with the patient preoperatively including but not limited to the risks of infection, bleeding, nerve injury, cardiopulmonary complications, the need for revision surgery, the need for hardware removal, among others, and the patient was willing to proceed.  OPERATIVE IMPLANTS: Biomet / Zimmer 1/3 tubular plate, with a single interfragmentary position screw.  OPERATIVE PROCEDURE: The patient was brought to the operating room and placed in the supine position. All bony prominences were padded. General anesthesia was administered. The lower extremity was prepped and draped in the usual sterile fashion.  Tourniquet was not utilized.  Time out was performed.   Incision was made over the distal fibula and the fracture was  exposed and reduced anatomically with a clamp. A position screw was placed. I then applied a 1/3 tubular plate and secured it proximally and distally non-locking screws. Bone quality was good. I used c-arm to confirm satisfactory reduction and fixation.   The syndesmosis was stressed using live fluoroscopy and found to be stable.   The wounds were irrigated, and closed with vicryl with routine closure for the skin. The wounds were injected with local anesthetic. Sterile gauze was applied followed by a posterior splint. She was awakened and returned to the PACU in stable and satisfactory condition. There were no complications.  3-Views plus a stress view of the ankle demonstrated appropriate reconstruction of the mortise with internal fixation and appropriate stability of the syndesmosis.

## 2020-06-25 NOTE — Discharge Instructions (Signed)
Diet: As you were doing prior to hospitalization   Shower:  May shower but keep the wounds dry, use an occlusive plastic wrap, NO SOAKING IN TUB.  If the bandage gets wet, change with a clean dry gauze.  If you have a splint on, leave the splint in place and keep the splint dry with a plastic bag.  Dressing:  You may change your dressing 3-5 days after surgery, unless you have a splint.  If you have a splint, then just leave the splint in place and we will change your bandages during your first follow-up appointment.    If you had hand or foot surgery, we will plan to remove your stitches in about 2 weeks in the office.  For all other surgeries, there are sticky tapes (steri-strips) on your wounds and all the stitches are absorbable.  Leave the steri-strips in place when changing your dressings, they will peel off with time, usually 2-3 weeks.  Activity:  Increase activity slowly as tolerated, but follow the weight bearing instructions below.  The rules on driving is that you can not be taking narcotics while you drive, and you must feel in control of the vehicle.    Weight Bearing:   No bearing weight on left foot  To prevent constipation: you may use a stool softener such as -  Colace (over the counter) 100 mg by mouth twice a day  Drink plenty of fluids (prune juice may be helpful) and high fiber foods Miralax (over the counter) for constipation as needed.    Itching:  If you experience itching with your medications, try taking only a single pain pill, or even half a pain pill at a time.  You may take up to 10 pain pills per day, and you can also use benadryl over the counter for itching or also to help with sleep.   Precautions:  If you experience chest pain or shortness of breath - call 911 immediately for transfer to the hospital emergency department!!  If you develop a fever greater that 101 F, purulent drainage from wound, increased redness or drainage from wound, or calf pain -- Call  the office at [phone]                                                Follow- Up Appointment:  Please call for an appointment to be seen in 2 weeks [place] - [phone]   May take Tylenol after 6:15pm, if needed.

## 2020-06-27 IMAGING — CT CT HEART SCORING
3 series · 14 of 20 positions shown, 16 images · non-contrast
Comparison: None.

CLINICAL DATA: Hyperlipidemia

EXAM:
CT HEART FOR CALCIUM SCORING
TECHNIQUE: CT heart was performed using prospective ECG gating.
A non-contrast exam for calcium scoring was performed.
Note that this exam targets the heart and the chest was not imaged
in its entirety.

[Series 2: calcium scoring 2.00 qr36 bestdiast 69% · axial · 0.44mm/px · z∈[+1507,+1615]mm · 4 of 90 slices shown]
[im 18/90  vessel]
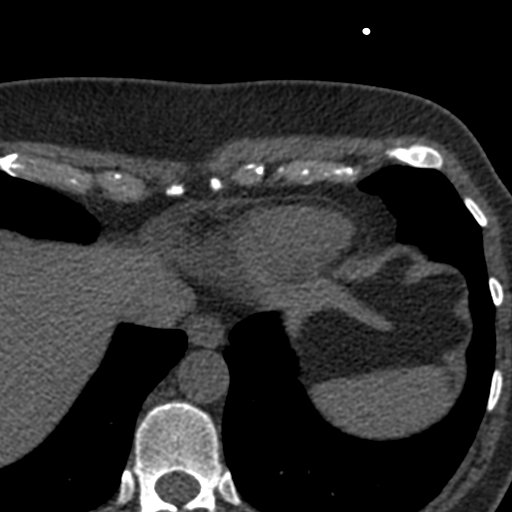
[im 36/90  vessel]
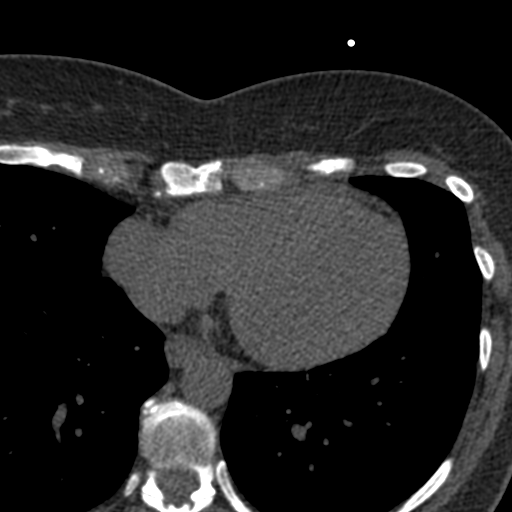
[im 54/90  vessel]
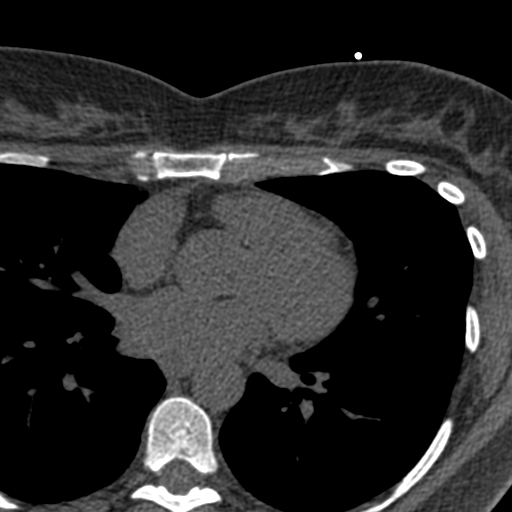
[im 72/90  vessel]
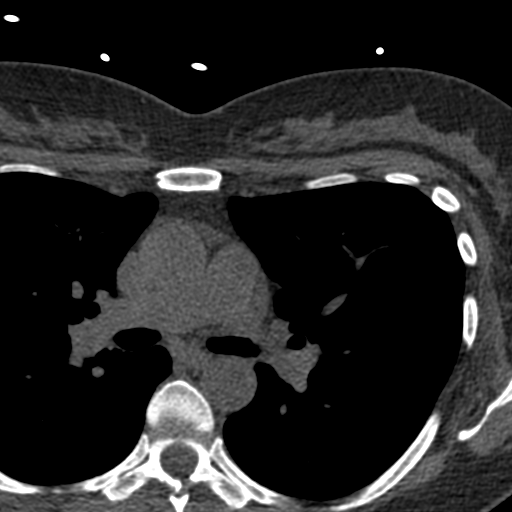

[Series 3: calcium scoring 2.00 br40 bestdiast 69% ax fov · axial · 0.59mm/px · z∈[+1499,+1611]mm · 5 of 84 slices shown, 7 images]
[im 14/84  vessel]
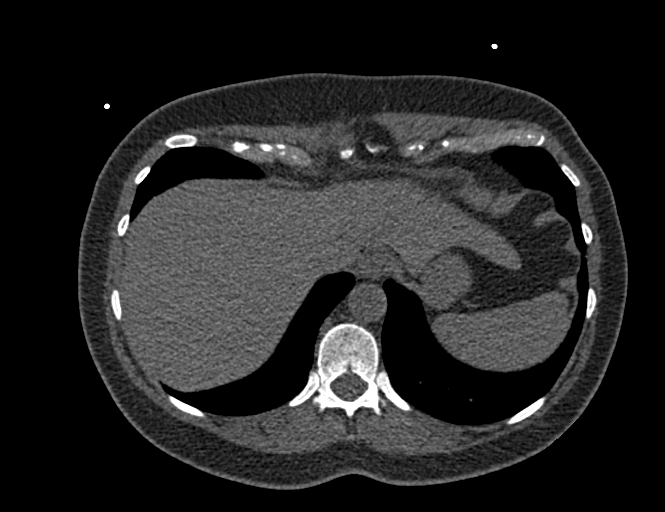
[im 14/84  lung]
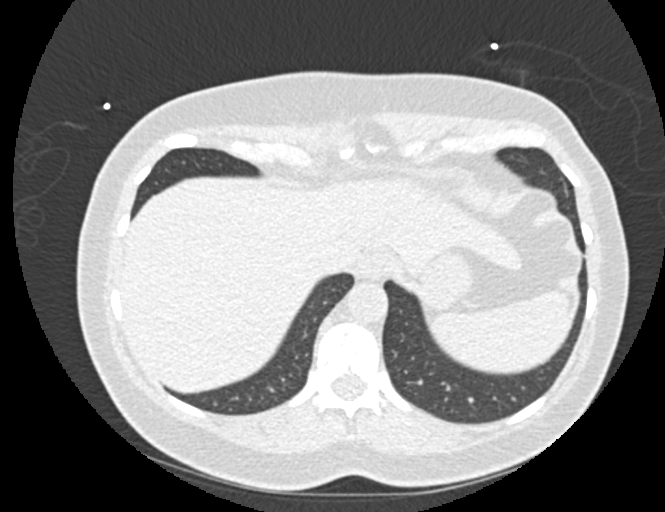
[im 28/84  vessel]
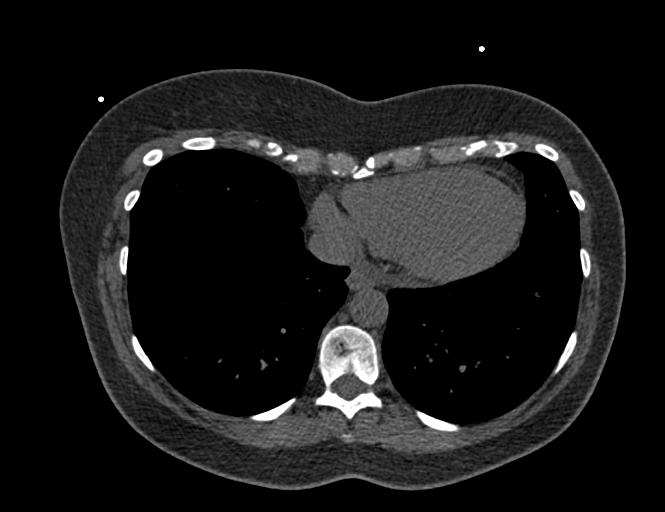
[im 42/84  vessel]
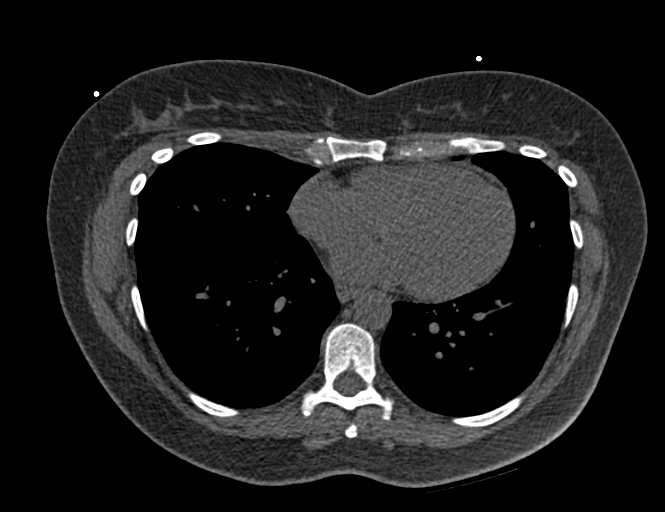
[im 56/84  vessel]
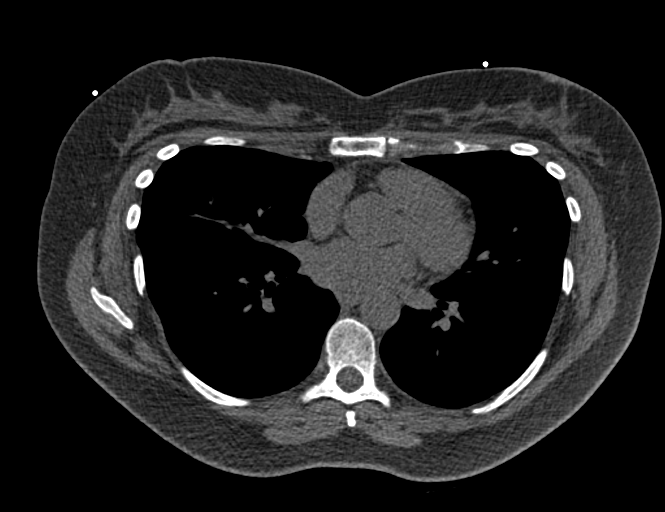
[im 70/84  vessel]
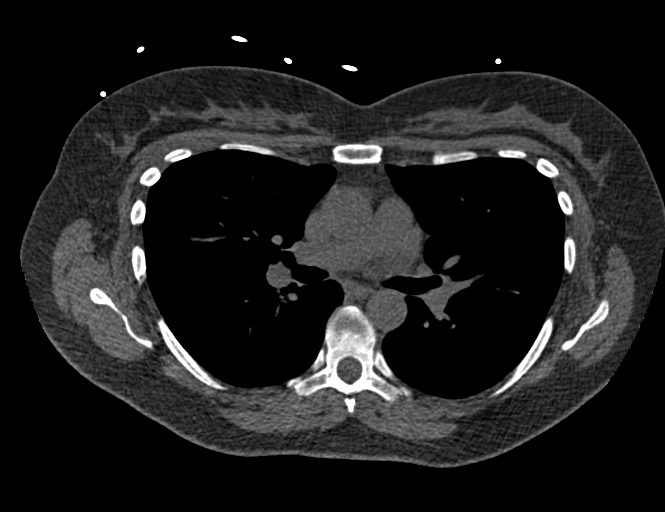
[im 70/84  lung]
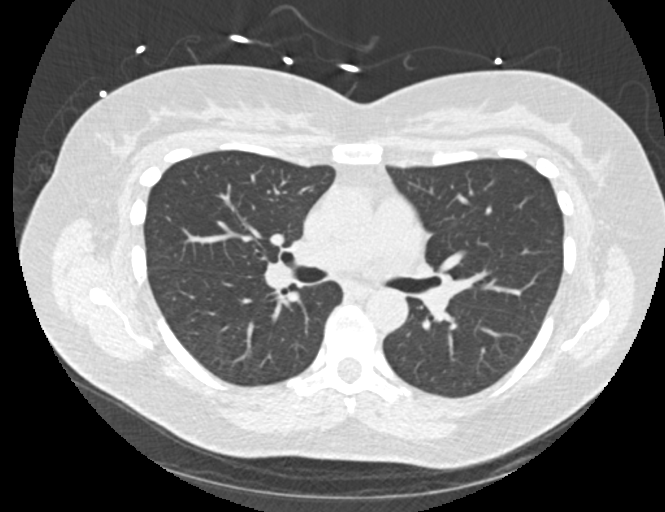

[Series 9: calcium scoring 2.00 br60 bestdiast 69% ax fov · axial · 0.59mm/px · z∈[+1499,+1611]mm · 5 of 84 slices shown]
[im 14/84  vessel]
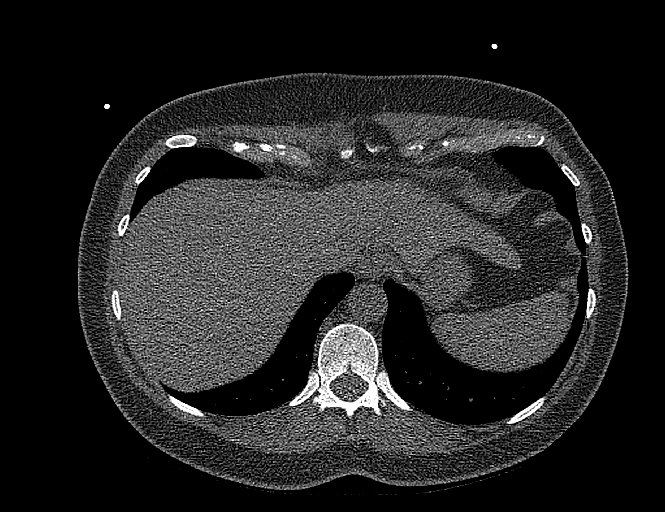
[im 28/84  vessel]
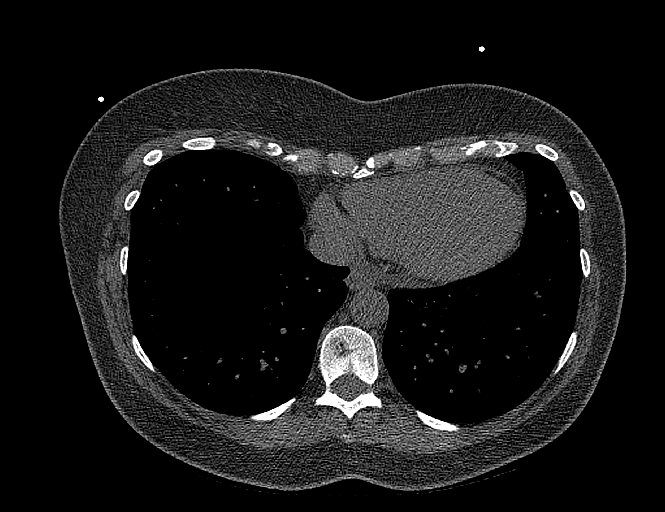
[im 42/84  vessel]
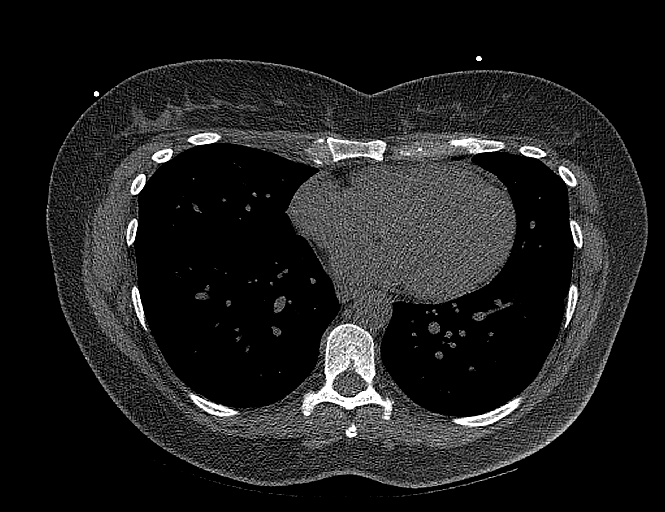
[im 56/84  vessel]
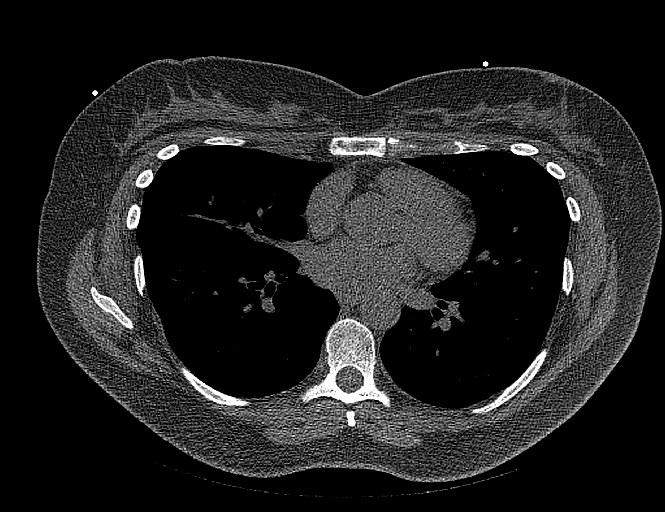
[im 70/84  vessel]
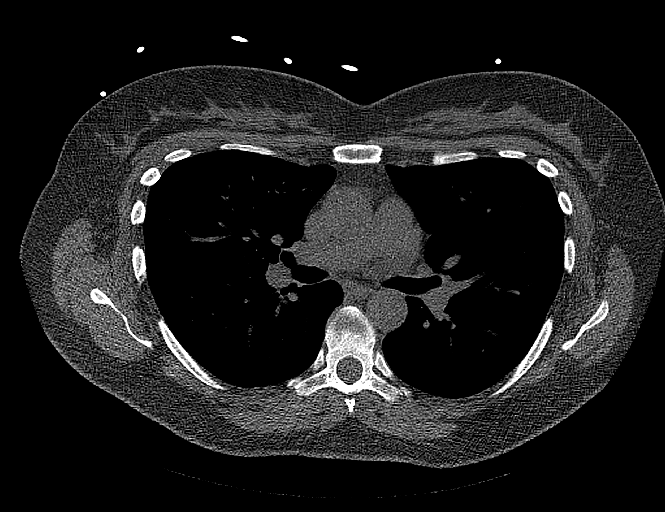

[14 of 20 positions shown; findings below may reference images not displayed]

FINDINGS: Technical quality: Good.

CORONARY CALCIUM

Total Agatston Score: 0

[HOSPITAL] percentile:  0

OTHER FINDINGS:

Cardiovascular: Heart is normal size. Visualized aorta is normal
caliber.

Mediastinum/Nodes: No adenopathy in the lower mediastinum or hila.

Lungs/Pleura: Visualized lungs clear.  No effusions.

Upper Abdomen: Imaging into the upper abdomen shows no acute
findings.

Musculoskeletal: Chest wall soft tissues are unremarkable. No acute
bony abnormality
IMPRESSION: No visible coronary artery calcifications. Total coronary calcium
score of 0.

No acute or significant extracardiac abnormality.

## 2020-07-05 ENCOUNTER — Encounter (HOSPITAL_BASED_OUTPATIENT_CLINIC_OR_DEPARTMENT_OTHER): Payer: Self-pay | Admitting: Orthopedic Surgery

## 2020-07-21 ENCOUNTER — Other Ambulatory Visit: Payer: Self-pay | Admitting: Obstetrics and Gynecology

## 2020-07-21 DIAGNOSIS — Z1231 Encounter for screening mammogram for malignant neoplasm of breast: Secondary | ICD-10-CM

## 2020-09-14 ENCOUNTER — Other Ambulatory Visit: Payer: Self-pay

## 2020-09-14 ENCOUNTER — Ambulatory Visit
Admission: RE | Admit: 2020-09-14 | Discharge: 2020-09-14 | Disposition: A | Discharge status: Home / Self Care | Payer: Managed Care, Other (non HMO) | Source: Ambulatory Visit | Attending: Obstetrics and Gynecology | Admitting: Obstetrics and Gynecology

## 2020-09-14 DIAGNOSIS — Z1231 Encounter for screening mammogram for malignant neoplasm of breast: Secondary | ICD-10-CM

## 2020-11-22 LAB — HM PAP SMEAR

## 2021-10-12 ENCOUNTER — Other Ambulatory Visit: Payer: Self-pay | Admitting: Obstetrics and Gynecology

## 2021-10-12 DIAGNOSIS — Z1231 Encounter for screening mammogram for malignant neoplasm of breast: Secondary | ICD-10-CM

## 2021-10-21 ENCOUNTER — Ambulatory Visit
Admission: RE | Admit: 2021-10-21 | Discharge: 2021-10-21 | Disposition: A | Discharge status: Home / Self Care | Payer: Managed Care, Other (non HMO) | Source: Ambulatory Visit | Attending: Obstetrics and Gynecology | Admitting: Obstetrics and Gynecology

## 2021-10-21 DIAGNOSIS — Z1231 Encounter for screening mammogram for malignant neoplasm of breast: Secondary | ICD-10-CM

## 2021-12-22 LAB — HM PAP SMEAR

## 2022-05-23 ENCOUNTER — Encounter: Payer: Self-pay | Admitting: Radiology

## 2022-05-23 ENCOUNTER — Ambulatory Visit: Payer: Managed Care, Other (non HMO) | Admitting: Radiology

## 2022-05-23 VITALS — BP 120/76 | Ht 67.0 in | Wt 173.0 lb

## 2022-05-23 DIAGNOSIS — Z79890 Hormone replacement therapy (postmenopausal): Secondary | ICD-10-CM

## 2022-05-23 DIAGNOSIS — N951 Menopausal and female climacteric states: Secondary | ICD-10-CM

## 2022-05-23 MED ORDER — PROGESTERONE MICRONIZED 100 MG PO CAPS
ORAL_CAPSULE | ORAL | 2 refills | Status: DC
Start: 2022-05-23 — End: 2022-12-26

## 2022-05-23 MED ORDER — ESTRADIOL 0.1 MG/24HR TD PTTW
1.0000 | MEDICATED_PATCH | TRANSDERMAL | 2 refills | Status: DC
Start: 2022-05-25 — End: 2022-12-26

## 2022-05-23 NOTE — Progress Notes (Signed)
   Amanda Haynes [birth year] [id number]   History: Postmenopausal 52 y.o. presents as a transfer from [facility], Dr Rana Snare. Would like to discuss her current HRT regimen. Having some sciatic nerve pain, does have a PCP. Due for AEX 11/2022. Mother had breast cancer in her early 14s.    Gynecologic History Postmenopausal Last Pap: 08/13/18. Results were: normal Last mammogram: 10/21/21. Results were: normal Last colonoscopy: 2/23 DEXA:2022 normal per patient HRT use: current  Obstetric History OB History  Gravida Para Term Preterm AB Living  5 3 3   1 3   SAB IAB Ectopic Multiple Live Births    1          # Outcome Date GA Lbr Len/2nd Weight Sex Delivery Anes PTL Lv  5 IAB           4 Term           3 Term           2 Term           1 Gravida              The following portions of the patient's history were reviewed and updated as appropriate: allergies, current medications, past family history, past medical history, past social history, past surgical history, and problem list.  Review of Systems Pertinent items noted in HPI and remainder of comprehensive ROS otherwise negative.  Past medical history, past surgical history, family history and social history were all reviewed and documented in the EPIC chart.  Exam:  Vitals:   05/23/22 1326  BP: 120/76  Weight: 173 lb (78.5 kg)  Height: 5\' 7"  (1.702 m)   Body mass index is 27.1 kg/m.  Physical Exam Vitals and nursing note reviewed.  Constitutional:      Appearance: Normal appearance. She is normal weight.  HENT:     Head: Normocephalic.  Cardiovascular:     Rate and Rhythm: Normal rate and regular rhythm.     Pulses: Normal pulses.     Heart sounds: Normal heart sounds.  Pulmonary:     Effort: Pulmonary effort is normal.     Breath sounds: Normal breath sounds.  Abdominal:     General: Abdomen is flat. Bowel sounds are normal.     Palpations: Abdomen is soft.  Skin:    General: Skin is warm and dry.  Psychiatric:         Mood and Affect: Mood normal.        Thought Content: Thought content normal.        Judgment: Judgment normal.      Raynelle Fanning, CMA present for exam  Assessment/Plan:   1. Hormone replacement therapy (HRT) Will change to patch for safety profile Continue mammograms yearly at [facility]  - estradiol (VIVELLE-DOT) 0.1 MG/24HR patch; Place 1 patch (0.1 mg total) onto the skin 2 (two) times a week.  Dispense: 24 patch; Refill: 2 - progesterone (PROMETRIUM) 100 MG capsule; Take 1 capsule every day by oral route at bedtime.  Dispense: 90 capsule; Refill: 2   Follow up October 2024 for AEX  Tanda Rockers WHNP-BC, 2:18 PM 05/23/2022

## 2022-06-02 ENCOUNTER — Encounter: Payer: Self-pay | Admitting: Genetic Counselor

## 2022-06-02 NOTE — Progress Notes (Signed)
UPDATE: MSH6 c.4000C>T (p.Arg1334Trp) VUS has been reclassified as Likely Benign.  The amended report date is May 29, 2022.

## 2022-09-26 ENCOUNTER — Other Ambulatory Visit: Payer: Self-pay | Admitting: Radiology

## 2022-09-26 DIAGNOSIS — Z1231 Encounter for screening mammogram for malignant neoplasm of breast: Secondary | ICD-10-CM

## 2022-10-24 ENCOUNTER — Ambulatory Visit
Admission: RE | Admit: 2022-10-24 | Discharge: 2022-10-24 | Disposition: A | Discharge status: Home / Self Care | Payer: Managed Care, Other (non HMO) | Source: Ambulatory Visit

## 2022-10-24 DIAGNOSIS — Z1231 Encounter for screening mammogram for malignant neoplasm of breast: Secondary | ICD-10-CM

## 2022-12-26 ENCOUNTER — Ambulatory Visit (INDEPENDENT_AMBULATORY_CARE_PROVIDER_SITE_OTHER): Payer: Managed Care, Other (non HMO) | Admitting: Radiology

## 2022-12-26 ENCOUNTER — Encounter: Payer: Self-pay | Admitting: Radiology

## 2022-12-26 VITALS — BP 124/82 | Ht 66.5 in | Wt 175.0 lb

## 2022-12-26 DIAGNOSIS — Z01419 Encounter for gynecological examination (general) (routine) without abnormal findings: Secondary | ICD-10-CM | POA: Diagnosis not present

## 2022-12-26 DIAGNOSIS — Z79890 Hormone replacement therapy (postmenopausal): Secondary | ICD-10-CM

## 2022-12-26 MED ORDER — PROGESTERONE MICRONIZED 100 MG PO CAPS: ORAL_CAPSULE | ORAL | 4 refills | Status: DC

## 2022-12-26 MED ORDER — ESTRADIOL 0.1 MG/24HR TD PTTW: 1.0000 | MEDICATED_PATCH | TRANSDERMAL | 4 refills | Status: DC

## 2022-12-26 NOTE — Progress Notes (Signed)
   KEIRY SAMPAYO Feb 12, 1971 [id number]   History: Postmenopausal 52 y.o. presents for annual exam. Doing well on HRT. First grandchild due in March. Exercising regularly.    Gynecologic History Postmenopausal Last Pap: 2023. Results were: normal Last mammogram: 2024. Results were: normal Last colonoscopy: 2/23   Obstetric History OB History  Gravida Para Term Preterm AB Living  5 3 3   1 3   SAB IAB Ectopic Multiple Live Births    1          # Outcome Date GA Lbr Len/2nd Weight Sex Type Anes PTL Lv  5 IAB           4 Term           3 Term           2 Term           1 Gravida              The following portions of the patient's history were reviewed and updated as appropriate: allergies, current medications, past family history, past medical history, past social history, past surgical history, and problem list.  Review of Systems Pertinent items noted in HPI and remainder of comprehensive ROS otherwise negative.  Past medical history, past surgical history, family history and social history were all reviewed and documented in the EPIC chart.  Exam:  Vitals:   12/26/22 0831  BP: 124/82  Weight: 175 lb (79.4 kg)  Height: 5' 6.5" (1.689 m)   Body mass index is 27.82 kg/m.  General appearance:  Normal Thyroid:  Symmetrical, normal in size, without palpable masses or nodularity. Respiratory  Auscultation:  Clear without wheezing or rhonchi Cardiovascular  Auscultation:  Regular rate, without rubs, murmurs or gallops  Edema/varicosities:  Not grossly evident Abdominal  Soft,nontender, without masses, guarding or rebound.  Liver/spleen:  No organomegaly noted  Hernia:  None appreciated  Skin  Inspection:  Grossly normal Breasts: Examined lying and sitting.   Right: Without masses, retractions, nipple discharge or axillary adenopathy.   Left: Without masses, retractions, nipple discharge or axillary adenopathy. Genitourinary   Inguinal/mons:  Normal without  inguinal adenopathy  External genitalia:  Normal appearing vulva with no masses, tenderness, or lesions  BUS/Urethra/Skene's glands:  Normal  Vagina:  Normal appearing with normal color and discharge, no lesions. Atrophy: mild   Cervix:  Normal appearing without discharge or lesions  Uterus:  Normal in size, shape and contour.  Midline and mobile, nontender  Adnexa/parametria:     Rt: Normal in size, without masses or tenderness.   Lt: Normal in size, without masses or tenderness.  Anus and perineum: Normal    Raynelle Fanning, CMA present for exam  Assessment/Plan:   1. Well woman exam with routine gynecological exam Pap 2026  2. Hormone replacement therapy (HRT) Doing well on current doses, will continue - progesterone (PROMETRIUM) 100 MG capsule; Take 1 capsule every day by oral route at bedtime.  Dispense: 90 capsule; Refill: 4 - estradiol (VIVELLE-DOT) 0.1 MG/24HR patch; Place 1 patch (0.1 mg total) onto the skin 2 (two) times a week.  Dispense: 24 patch; Refill: 4    Discussed SBE, colonoscopy and DEXA screening as directed. Recommend of exercise weekly, including weight bearing exercise. Increase protein and fiber. Encouraged the use of seatbelts and sunscreen.  Return in 1 year for annual or sooner prn.  Tanda Rockers WHNP-BC, 8:55 AM 12/26/2022

## 2022-12-26 NOTE — Patient Instructions (Signed)
Preventive Care 40-53 Years Old, Female Preventive care refers to lifestyle choices and visits with your health care provider that can promote health and wellness. Preventive care visits are also called wellness exams. What can I expect for my preventive care visit? Counseling Your health care provider may ask you questions about your: Medical history, including: Past medical problems. Family medical history. Pregnancy history. Current health, including: Menstrual cycle. Method of birth control. Emotional well-being. Home life and relationship well-being. Sexual activity and sexual health. Lifestyle, including: Alcohol, nicotine or tobacco, and drug use. Access to firearms. Diet, exercise, and sleep habits. Work and work environment. Sunscreen use. Safety issues such as seatbelt and bike helmet use. Physical exam Your health care provider will check your: Height and weight. These may be used to calculate your BMI (body mass index). BMI is a measurement that tells if you are at a healthy weight. Waist circumference. This measures the distance around your waistline. This measurement also tells if you are at a healthy weight and may help predict your risk of certain diseases, such as type 2 diabetes and high blood pressure. Heart rate and blood pressure. Body temperature. Skin for abnormal spots. What immunizations do I need?  Vaccines are usually given at various ages, according to a schedule. Your health care provider will recommend vaccines for you based on your age, medical history, and lifestyle or other factors, such as travel or where you work. What tests do I need? Screening Your health care provider may recommend screening tests for certain conditions. This may include: Lipid and cholesterol levels. Diabetes screening. This is done by checking your blood sugar (glucose) after you have not eaten for a while (fasting). Pelvic exam and Pap test. Hepatitis B test. Hepatitis C  test. HIV (human immunodeficiency virus) test. STI (sexually transmitted infection) testing, if you are at risk. Lung cancer screening. Colorectal cancer screening. Mammogram. Talk with your health care provider about when you should start having regular mammograms. This may depend on whether you have a family history of breast cancer. BRCA-related cancer screening. This may be done if you have a family history of breast, ovarian, tubal, or peritoneal cancers. Bone density scan. This is done to screen for osteoporosis. Talk with your health care provider about your test results, treatment options, and if necessary, the need for more tests. Follow these instructions at home: Eating and drinking  Eat a diet that includes fresh fruits and vegetables, whole grains, lean protein, and low-fat dairy products. Take vitamin and mineral supplements as recommended by your health care provider. Do not drink alcohol if: Your health care provider tells you not to drink. You are pregnant, may be pregnant, or are planning to become pregnant. If you drink alcohol: Limit how much you have to 0-1 drink a day. Know how much alcohol is in your drink. In the [place] one drink equals one 12 oz bottle of beer (355 mL), one 5 oz glass of wine (148 mL), or one 1 oz glass of hard liquor (44 mL). Lifestyle Brush your teeth every morning and night with fluoride toothpaste. Floss one time each day. Exercise for at least 30 minutes 5 or more days each week. Do not use any products that contain nicotine or tobacco. These products include cigarettes, chewing tobacco, and vaping devices, such as e-cigarettes. If you need help quitting, ask your health care provider. Do not use drugs. If you are sexually active, practice safe sex. Use a condom or other form of protection to   prevent STIs. If you do not wish to become pregnant, use a form of birth control. If you plan to become pregnant, see your health care provider for a  prepregnancy visit. Take aspirin only as told by your health care provider. Make sure that you understand how much to take and what form to take. Work with your health care provider to find out whether it is safe and beneficial for you to take aspirin daily. Find healthy ways to manage stress, such as: Meditation, yoga, or listening to music. Journaling. Talking to a trusted person. Spending time with friends and family. Minimize exposure to UV radiation to reduce your risk of skin cancer. Safety Always wear your seat belt while driving or riding in a vehicle. Do not drive: If you have been drinking alcohol. Do not ride with someone who has been drinking. When you are tired or distracted. While texting. If you have been using any mind-altering substances or drugs. Wear a helmet and other protective equipment during sports activities. If you have firearms in your house, make sure you follow all gun safety procedures. Seek help if you have been physically or sexually abused. What's next? Visit your health care provider once a year for an annual wellness visit. Ask your health care provider how often you should have your eyes and teeth checked. Stay up to date on all vaccines. This information is not intended to replace advice given to you by your health care provider. Make sure you discuss any questions you have with your health care provider. Document Revised: 07/28/2020 Document Reviewed: 07/28/2020 Elsevier Patient Education  2024 [employer] Inc[employer]  

## 2023-11-02 ENCOUNTER — Ambulatory Visit (HOSPITAL_BASED_OUTPATIENT_CLINIC_OR_DEPARTMENT_OTHER)
Admission: RE | Admit: 2023-11-02 | Discharge: 2023-11-02 | Disposition: A | Discharge status: Home / Self Care | Source: Ambulatory Visit

## 2023-11-02 ENCOUNTER — Other Ambulatory Visit (HOSPITAL_BASED_OUTPATIENT_CLINIC_OR_DEPARTMENT_OTHER): Payer: Self-pay | Admitting: Radiology

## 2023-11-02 ENCOUNTER — Encounter (HOSPITAL_BASED_OUTPATIENT_CLINIC_OR_DEPARTMENT_OTHER): Payer: Self-pay | Admitting: Radiology

## 2023-11-02 DIAGNOSIS — Z1231 Encounter for screening mammogram for malignant neoplasm of breast: Secondary | ICD-10-CM | POA: Diagnosis present

## 2023-11-23 ENCOUNTER — Ambulatory Visit

## 2023-11-23 ENCOUNTER — Other Ambulatory Visit: Payer: Self-pay

## 2023-11-23 VITALS — BP 118/82 | Ht 67.0 in | Wt 170.0 lb

## 2023-11-23 DIAGNOSIS — G5712 Meralgia paresthetica, left lower limb: Secondary | ICD-10-CM | POA: Diagnosis not present

## 2023-11-23 DIAGNOSIS — M79605 Pain in left leg: Secondary | ICD-10-CM

## 2023-11-23 DIAGNOSIS — M7632 Iliotibial band syndrome, left leg: Secondary | ICD-10-CM

## 2023-11-23 MED ORDER — MELOXICAM 15 MG PO TABS: 15.0000 mg | ORAL_TABLET | Freq: Every day | ORAL | 1 refills | Status: DC

## 2023-11-23 NOTE — Progress Notes (Signed)
 PCP: Ginette Shasta NOVAK, NP  Subjective:   HPI: Patient is a 53 y.o. female here for left IT band pain is been present for years.  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  She has had years of left anterior lateral thigh tingling and numbness, as well as tightness from her knee up to her lateral hip.  She does not usually take medications for this type of pain but has tried Voltaren gel which has been helpful.  She did visit with a physical therapist who tried dry needling over the area which she states was not helpful.  She denies any previous history of problems with her back, surgeries, injuries.  She states that overall the tingling and numbness of her left thigh is what bothers her more than the tightness she experiences.  She denies wearing any tight belts, or having any type of outerwear that is over her anterior left hip.  Past Medical History:  Diagnosis Date   Anxiety    Asthma    exercised induced   Bursitis    Clostridium difficile diarrhea 2014   DES exposure in utero    Family history of breast cancer    Genetic testing 05/17/2017   Breast/GYN panel (23 genes) @ [facility] - No pathogenic mutations detected.  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    High cholesterol    LGSIL (low grade squamous intraepithelial dysplasia) 2005   colposcopy normal, all subsequent Pap smears negative    Current Outpatient Medications on File Prior to Visit  Medication Sig Dispense Refill   B Complex Vitamins (B COMPLEX PO) Take by mouth.     Cholecalciferol (D3 PO) Take by mouth. Vitamin d3 with K2     Coenzyme Q10 (COQ10 PO) Take by mouth.     estradiol  (VIVELLE -DOT) 0.1 MG/24HR patch Place 1 patch (0.1 mg total) onto the skin 2 (two) times a week. 24 patch 4   MAGNESIUM PO MAGNESIUM CAPSULE     Multiple Vitamins-Minerals (HAIR SKIN & NAILS PO) Take by mouth.     Omega-3 Fatty Acids (FISH OIL) 1000 MG CPDR Take  by mouth.     progesterone  (PROMETRIUM ) 100 MG capsule Take 1 capsule every day by oral route at bedtime. 90 capsule 4   rosuvastatin (CRESTOR) 5 MG tablet Take 5 mg by mouth daily.     traZODone (DESYREL) 50 MG tablet Take 50 mg by mouth at bedtime.     No current facility-administered medications on file prior to visit.    BP 118/82   Ht 5' 7 (1.702 m)   Wt 170 lb (77.1 kg)   LMP 06/24/2018 Comment: husband has had vasectomy  BMI 26.63 kg/m        Objective:   Physical Exam:  Gen: NAD, comfortable in exam room Left hip Inspection: Free from erythema, edema or warmth Palpation: Mildly tender over the greater trochanter ROM: Full hip flexion, full internal and external rotation without pain Special Tests: Negative FABER, negative FADIR, negative Stinchfield, negative logroll, positive ober, positive Noble, negative straight leg test Neuro: Strength is 5/5 with hip flexion, knee extension, knee flexion, dorsiflexion.  Sensation is reported different of the anterior lateral left thigh not going past the knee  Limited ultrasound of the left hip/anterior thigh - ASIS viewed in short and long axis without any hypoechoic changes or inflammation - TFL, sartorius, rectus femoris viewed with no hypoechoic change - Lateral femoral cutaneous nerve  viewed in SAX and LAX with no significant hypoechoic changes, possible slight area of hypoechoic change present but minimal - Left greater trochanter evaluated with minimal evidence of bursitis  Impression: Possible area of hypoechoic change near the lateral femoral cutaneous nerve.  Mostly normal.  Ultrasound and interpretation by Rainell Cedar, DO and Krystal Lowing, DO    Assessment/Plan:   Amanda Haynes is a 53 y.o. female who was seen today for the following: 1. Left leg pain (Primary) 2. It band syndrome, left 3. Meralgia paresthetica of left side - US  LIMITED JOINT SPACE STRUCTURES LOW LEFT; Future - meloxicam (MOBIC) 15 MG  tablet; Take 1 tablet (15 mg total) by mouth daily.  Dispense: 30 tablet; Refill: 1 - Patient is likely dealing from combination meralgia paresthetica and a tight IT band - We will start with home exercises to improve her IT band - We will also start Mobic to aid in reducing inflammation or pain - We will see her back in about 4 weeks and can consider possible steroid injection of the lateral femoral cutaneous nerve if her symptoms are not improved  Follow-up/Education:   No follow-ups on file.   May return sooner as needed and encouraged to call/e-mail for additional questions or  worsening symptoms in the interim.  Krystal Lowing, DO Sports Medicine Fellow 11/23/2023 11:23 AM

## 2023-12-26 ENCOUNTER — Ambulatory Visit

## 2024-01-02 ENCOUNTER — Ambulatory Visit

## 2024-01-02 VITALS — BP 126/76 | Ht 67.0 in | Wt 170.0 lb

## 2024-01-02 DIAGNOSIS — M7632 Iliotibial band syndrome, left leg: Secondary | ICD-10-CM | POA: Diagnosis not present

## 2024-01-02 DIAGNOSIS — G5712 Meralgia paresthetica, left lower limb: Secondary | ICD-10-CM

## 2024-01-02 NOTE — Progress Notes (Signed)
 PCP: Ginette Shasta NOVAK, NP  Subjective:   HPI: Patient is a 54 y.o. female here for follow-up left IT band, as well as meralgia paresthetica on the left.  At her last visit on 11/23/2023, patient was diagnosed with IT band, Raj paresthetica and was prescribed Mobic , as well as home exercises to improve the IT band.  She states that overall she is feeling much improved to where she was.  She still has the tingling and numbness in the lateral femoral cutaneous nerve distribution at times but it has improved.  She also states that her left IT band pain has been improving as well with the home exercises.  She did go on vacation for couple weeks and really had no flareups but was not as consistent on her home exercises but is still been doing well.  She has had relief with the Mobic  as well.  Past Medical History:  Diagnosis Date   Anxiety    Asthma    exercised induced   Bursitis    Clostridium difficile diarrhea 2014   DES exposure in utero    Family history of breast cancer    Genetic testing 05/17/2017   Breast/GYN panel (23 genes) @ [facility] - No pathogenic mutations detected.  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    High cholesterol    LGSIL (low grade squamous intraepithelial dysplasia) 2005   colposcopy normal, all subsequent Pap smears negative    Current Outpatient Medications on File Prior to Visit  Medication Sig Dispense Refill   B Complex Vitamins (B COMPLEX PO) Take by mouth.     Cholecalciferol (D3 PO) Take by mouth. Vitamin d3 with K2     Coenzyme Q10 (COQ10 PO) Take by mouth.     estradiol  (VIVELLE -DOT) 0.1 MG/24HR patch Place 1 patch (0.1 mg total) onto the skin 2 (two) times a week. 24 patch 4   MAGNESIUM PO MAGNESIUM CAPSULE     meloxicam  (MOBIC ) 15 MG tablet Take 1 tablet (15 mg total) by mouth daily. 30 tablet 1   Multiple Vitamins-Minerals (HAIR SKIN & NAILS PO) Take by mouth.     Omega-3 Fatty Acids (FISH OIL) 1000 MG CPDR Take by  mouth.     progesterone  (PROMETRIUM ) 100 MG capsule Take 1 capsule every day by oral route at bedtime. 90 capsule 4   rosuvastatin (CRESTOR) 5 MG tablet Take 5 mg by mouth daily.     traZODone (DESYREL) 50 MG tablet Take 50 mg by mouth at bedtime.     No current facility-administered medications on file prior to visit.    BP 126/76   Ht 5' 7 (1.702 m)   Wt 170 lb (77.1 kg)   LMP 06/24/2018 Comment: husband has had vasectomy  BMI 26.63 kg/m        Objective:   Physical Exam:  Gen: NAD, comfortable in exam room Left hip, IT band Palpation: Nearly no tenderness over the left greater trochanter ROM: Full hip flexion, full internal and external rotation Special Tests: OBER is improved from previous, negative Noble Neuro: Strength and sensation is maintained, although still has reported sensation difference along the distribution of the lateral femoral cutaneous nerve on the left  Assessment/Plan:   Amanda Haynes is a 53 y.o. female who was seen today for the following: 1. It band syndrome, left (Primary) 2. Meralgia paresthetica of left side - Patient has progressed well with home exercises and Mobic  - Discussed that the Mobic  can be taken as a  as needed basis moving forward - Discussed with her that at any point in the future, she can return for a diagnostic injection of the left lateral femoral cutaneous nerve - Discussed risks and benefits of injection - At this point, she would like to continue her home exercises and if things change or worsen, she will pursue injection - She can follow-up as needed from here on out  Follow-up/Education:   Return if symptoms worsen or fail to improve.   May return sooner as needed and encouraged to call/e-mail for additional questions or  worsening symptoms in the interim.  Krystal Lowing, DO Sports Medicine Fellow 01/02/2024 9:26 AM

## 2024-01-07 ENCOUNTER — Other Ambulatory Visit: Payer: Self-pay | Admitting: Radiology

## 2024-01-07 DIAGNOSIS — Z79890 Hormone replacement therapy (postmenopausal): Secondary | ICD-10-CM

## 2024-01-07 NOTE — Telephone Encounter (Signed)
 Med refill request:   progesterone  (PROMETRIUM ) 100 MG capsule  Start:  12/26/22 Disp:  90 capsules Refills:   4  Last AEX:  12/26/22 - Next AEX:  Not yet scheduled -  [employer] desk has been asked to contact Pt to schedule an annual visit.  Last MMG (if hormonal med):  11/02/23 Refill authorized? Please Advise.

## 2024-01-24 ENCOUNTER — Other Ambulatory Visit: Payer: Self-pay

## 2024-01-24 DIAGNOSIS — M7632 Iliotibial band syndrome, left leg: Secondary | ICD-10-CM

## 2024-02-16 ENCOUNTER — Other Ambulatory Visit: Payer: Self-pay | Admitting: Radiology

## 2024-02-16 DIAGNOSIS — Z79890 Hormone replacement therapy (postmenopausal): Secondary | ICD-10-CM

## 2024-02-18 NOTE — Telephone Encounter (Addendum)
 Med refill request:  Estradiol  (VIVELLE -DOT) 0.1 MG/24HR patch Start:  12/28/2022  Disp:  24 patch  Refills:  4  Last AEX:  12/26/22 Next AEX:  03/14/24 Last MMG (if hormonal med):  11/02/23 Refill authorized? Please Advise.

## 2024-03-14 ENCOUNTER — Ambulatory Visit (INDEPENDENT_AMBULATORY_CARE_PROVIDER_SITE_OTHER): Admitting: Radiology

## 2024-03-14 ENCOUNTER — Other Ambulatory Visit (HOSPITAL_COMMUNITY)
Admission: RE | Admit: 2024-03-14 | Discharge: 2024-03-14 | Disposition: A | Source: Ambulatory Visit | Attending: Radiology | Admitting: Radiology

## 2024-03-14 ENCOUNTER — Encounter: Payer: Self-pay | Admitting: Radiology

## 2024-03-14 VITALS — BP 130/76 | HR 98 | Ht 66.5 in | Wt 181.0 lb

## 2024-03-14 DIAGNOSIS — Z1331 Encounter for screening for depression: Secondary | ICD-10-CM

## 2024-03-14 DIAGNOSIS — Z79890 Hormone replacement therapy (postmenopausal): Secondary | ICD-10-CM | POA: Diagnosis not present

## 2024-03-14 DIAGNOSIS — Z01419 Encounter for gynecological examination (general) (routine) without abnormal findings: Secondary | ICD-10-CM | POA: Insufficient documentation

## 2024-03-14 MED ORDER — PROGESTERONE MICRONIZED 100 MG PO CAPS: ORAL_CAPSULE | ORAL | 4 refills | Status: AC

## 2024-03-14 MED ORDER — ESTRADIOL 0.1 MG/24HR TD PTTW: 1.0000 | MEDICATED_PATCH | TRANSDERMAL | 4 refills | Status: AC

## 2024-03-14 NOTE — Progress Notes (Signed)
 "  Amanda Haynes September 25, 1970 [id number]   History: Postmenopausal 54 y.o. presents for annual exam. Doing well on HRT, having trouble with sleep lately, increased stress at work and home. Husband recently diagnosed with prostate CA. Does not feel rested despite sleeping 8 hours/night. Had a new grandson in the spring.    Gynecologic History Postmenopausal Last Pap: 2023. Results were: normal Last mammogram: 9/25. Results were: normal Last colonoscopy: 2023   Obstetric History OB History  Gravida Para Term Preterm AB Living  5 3 3  1 3   SAB IAB Ectopic Multiple Live Births   1       # Outcome Date GA Lbr Len/2nd Weight Sex Type Anes PTL Lv  5 IAB           4 Term           3 Term           2 Term           1 Gravida                03/14/2024    8:56 AM 10/25/2015    5:19 PM  Depression screen PHQ 2/9  Decreased Interest 0 0  Down, Depressed, Hopeless 0 0  PHQ - 2 Score 0 0     The following portions of the patient's history were reviewed and updated as appropriate: allergies, current medications, past family history, past medical history, past social history, past surgical history, and problem list.  Review of Systems Pertinent items are noted in HPI.  Past medical history, past surgical history, family history and social history were all reviewed and documented in the EPIC chart.  Exam:  Vitals:   03/14/24 0854  BP: 130/76  Pulse: 98  SpO2: 96%  Weight: 181 lb (82.1 kg)  Height: 5' 6.5 (1.689 m)   Body mass index is 28.78 kg/m.  General appearance:  Normal Thyroid:  Symmetrical, normal in size, without palpable masses or nodularity. Respiratory  Auscultation:  Clear without wheezing or rhonchi Cardiovascular  Auscultation:  Regular rate, without rubs, murmurs or gallops  Edema/varicosities:  Not grossly evident Abdominal  Soft,nontender, without masses, guarding or rebound.  Liver/spleen:  No organomegaly noted  Hernia:  None appreciated   Skin  Inspection:  Grossly normal Breasts: Examined lying and sitting.   Right: Without masses, retractions, nipple discharge or axillary adenopathy.   Left: Without masses, retractions, nipple discharge or axillary adenopathy. Genitourinary   Inguinal/mons:  Normal without inguinal adenopathy  External genitalia:  Normal appearing vulva with no masses, tenderness, or lesions  BUS/Urethra/Skene's glands:  Normal  Vagina:  Normal appearing with normal color and discharge, no lesions. Atrophy: mild   Cervix:  Normal appearing without discharge or lesions  Uterus:  Normal in size, shape and contour.  Midline and mobile, nontender  Adnexa/parametria:     Rt: Normal in size, without masses or tenderness.   Lt: Normal in size, without masses or tenderness.  Anus and perineum: Normal    Darice Hoit, CMA present for exam  Assessment/Plan:   1. Well woman exam with routine gynecological exam (Primary) - Cytology - PAP( [place])  2. Hormone replacement therapy (HRT) Will increase progesterone  to help with sleep - estradiol  (VIVELLE -DOT) 0.1 MG/24HR patch; Place 1 patch (0.1 mg total) onto the skin 2 (two) times a week.  Dispense: 24 patch; Refill: 4 - progesterone  (PROMETRIUM ) 100 MG capsule; TAKE 2 CAPSULES BY MOUTH EVERYDAY AT BEDTIME  Dispense: 180  capsule; Refill: 4  3. Depression screening    Return in 1 year for annual or sooner prn.  Liddie Chichester B WHNP-BC, 9:17 AM 03/14/2024 "

## 2024-03-17 ENCOUNTER — Ambulatory Visit: Payer: Self-pay | Admitting: Radiology

## 2024-03-17 LAB — CYTOLOGY - PAP
Comment: NEGATIVE
Diagnosis: NEGATIVE
High risk HPV: NEGATIVE
